# Patient Record
Sex: Female | Born: 1955 | Race: Black or African American | Hispanic: No | Marital: Single | Smoking: Never smoker
Health system: Southern US, Community
[De-identification: ages and names within clinical notes are randomized; demographics above are authoritative.]

## PROBLEM LIST (undated history)

## (undated) DIAGNOSIS — M1990 Unspecified osteoarthritis, unspecified site: Secondary | ICD-10-CM

## (undated) DIAGNOSIS — I10 Essential (primary) hypertension: Secondary | ICD-10-CM

## (undated) HISTORY — DX: Unspecified osteoarthritis, unspecified site: M19.90

## (undated) HISTORY — DX: Essential (primary) hypertension: I10

---

## 2020-11-29 DIAGNOSIS — K8020 Calculus of gallbladder without cholecystitis without obstruction: Secondary | ICD-10-CM

## 2020-11-29 HISTORY — DX: Calculus of gallbladder without cholecystitis without obstruction: K80.20

## 2020-11-30 ENCOUNTER — Other Ambulatory Visit: Payer: Self-pay

## 2020-11-30 ENCOUNTER — Encounter: Payer: Self-pay | Admitting: Internal Medicine

## 2020-11-30 ENCOUNTER — Ambulatory Visit (INDEPENDENT_AMBULATORY_CARE_PROVIDER_SITE_OTHER): Payer: 59 | Admitting: Internal Medicine

## 2020-11-30 ENCOUNTER — Ambulatory Visit: Payer: 59 | Admitting: Internal Medicine

## 2020-11-30 VITALS — BP 198/94 | HR 74 | Ht 63.0 in | Wt 304.0 lb

## 2020-11-30 DIAGNOSIS — Z131 Encounter for screening for diabetes mellitus: Secondary | ICD-10-CM

## 2020-11-30 DIAGNOSIS — Z6841 Body Mass Index (BMI) 40.0 and over, adult: Secondary | ICD-10-CM

## 2020-11-30 DIAGNOSIS — Z1152 Encounter for screening for COVID-19: Secondary | ICD-10-CM | POA: Diagnosis not present

## 2020-11-30 DIAGNOSIS — G4733 Obstructive sleep apnea (adult) (pediatric): Secondary | ICD-10-CM

## 2020-11-30 DIAGNOSIS — I10 Essential (primary) hypertension: Secondary | ICD-10-CM | POA: Insufficient documentation

## 2020-11-30 LAB — POC COVID19 BINAXNOW: SARS Coronavirus 2 Ag: NEGATIVE

## 2020-11-30 LAB — GLUCOSE, POCT (MANUAL RESULT ENTRY): POC Glucose: 101 mg/dl — AB (ref 70–99)

## 2020-11-30 MED ORDER — LISINOPRIL-HYDROCHLOROTHIAZIDE 20-12.5 MG PO TABS: 1.0000 | ORAL_TABLET | Freq: Every day | ORAL | 3 refills | Status: DC

## 2020-11-30 NOTE — Assessment & Plan Note (Signed)
-   Today, the patient's blood pressure is not well managed on unknown med. - The patient will change the current treatment regimen.  - I encouraged the patient to eat a low-sodium diet to help control blood pressure. - I encouraged the patient to live an active lifestyle and complete activities that increases heart rate to 85% target heart rate at least 5 times per week for one hour.

## 2020-11-30 NOTE — Assessment & Plan Note (Signed)
Pt is covid neg

## 2020-11-30 NOTE — Assessment & Plan Note (Signed)
Need evaluation/ lose wt

## 2020-11-30 NOTE — Assessment & Plan Note (Signed)
-   I encouraged the patient to lose weight.  - I educated them on making healthy dietary choices including eating more fruits and vegetables and less fried foods. - I encouraged the patient to exercise more, and educated on the benefits of exercise including weight loss, diabetes prevention, and hypertension management.   

## 2020-11-30 NOTE — Progress Notes (Signed)
Acute Office Visit  Subjective:    Patient ID: Dawn Mahoney, female    DOB: 08-Feb-1956, 65 y.o.   MRN: [id number]  Chief Complaint  Patient presents with  . New Patient (Initial Visit)    Hypertension This is a chronic problem. The current episode started more than 1 year ago. The problem is unchanged. Associated symptoms include orthopnea (loud snoring) and shortness of breath. Pertinent negatives include no anxiety, blurred vision, malaise/fatigue, neck pain or palpitations. Risk factors for coronary artery disease include obesity and post-menopausal state. Past treatments include nothing. Compliance problems include diet and exercise.  There is no history of angina, kidney disease, CAD/MI, CVA, heart failure or PVD. Identifiable causes of hypertension include sleep apnea. There is no history of chronic renal disease or a thyroid problem.   Patient is in today for hbp check  Past Medical History:  Diagnosis Date  . Hypertension     History reviewed. No pertinent surgical history.  History reviewed. No pertinent family history.  Social History   Socioeconomic History  . Marital status: Single    Spouse name: Not on file  . Number of children: Not on file  . Years of education: Not on file  . Highest education level: Not on file  Occupational History  . Not on file  Tobacco Use  . Smoking status: Never Smoker  . Smokeless tobacco: Never Used  Substance and Sexual Activity  . Alcohol use: Never  . Drug use: Never  . Sexual activity: Not Currently  Other Topics Concern  . Not on file  Social History Narrative  . Not on file   Social Determinants of Health   Financial Resource Strain: Not on file  Food Insecurity: Not on file  Transportation Needs: Not on file  Physical Activity: Not on file  Stress: Not on file  Social Connections: Not on file  Intimate Partner Violence: Not on file    No outpatient medications prior to visit.   No facility-administered  medications prior to visit.    No Known Allergies  Review of Systems  Constitutional: Negative for chills, fatigue and malaise/fatigue.  HENT: Positive for dental problem. Negative for nosebleeds.   Eyes: Negative for blurred vision and pain.  Respiratory: Positive for shortness of breath. Negative for cough, chest tightness and wheezing.   Cardiovascular: Positive for orthopnea (loud snoring). Negative for palpitations and leg swelling.  Gastrointestinal: Positive for abdominal distention.  Genitourinary: Negative for difficulty urinating and flank pain.  Musculoskeletal: Positive for arthralgias, back pain, gait problem and joint swelling. Negative for neck pain.  Neurological: Positive for dizziness. Negative for seizures.  Psychiatric/Behavioral: Negative for behavioral problems.       Objective:    Physical Exam Vitals reviewed.  Constitutional:      Appearance: She is obese.  HENT:     Head: Atraumatic.     Mouth/Throat:     Mouth: Mucous membranes are moist.  Eyes:     Pupils: Pupils are equal, round, and reactive to light.  Cardiovascular:     Rate and Rhythm: Normal rate and regular rhythm.     Heart sounds: No murmur heard.   Abdominal:     General: There is distension.  Musculoskeletal:     Cervical back: No rigidity.  Skin:    Coloration: Skin is not jaundiced.  Neurological:     General: No focal deficit present.  Psychiatric:        Behavior: Behavior normal.     BP Marland Kitchen)  198/94   Pulse 74   Ht 5' 3"  (1.6 m)   Wt (!) 304 lb (137.9 kg)   BMI 53.85 kg/m  Wt Readings from Last 3 Encounters:  11/30/20 (!) 304 lb (137.9 kg)    Health Maintenance Due  Topic Date Due  . Hepatitis C Screening  Never done  . COVID-19 Vaccine (1) Never done  . TETANUS/TDAP  Never done  . COLONOSCOPY (Pts 45-62yr Insurance coverage will need to be confirmed)  Never done  . MAMMOGRAM  Never done  . DEXA SCAN  Never done  . PNA vac Low Risk Adult (1 of 2 - PCV13)  Never done  . INFLUENZA VACCINE  Never done    There are no preventive care reminders to display for this patient.   No results found for: TSH No results found for: WBC, HGB, HCT, MCV, PLT No results found for: NA, K, CHLORIDE, CO2, GLUCOSE, BUN, CREATININE, BILITOT, ALKPHOS, AST, ALT, PROT, ALBUMIN, CALCIUM, ANIONGAP, EGFR, GFR No results found for: CHOL No results found for: HDL No results found for: LDLCALC No results found for: TRIG No results found for: CHOLHDL No results found for: HGBA1C     Assessment & Plan:   Problem List Items Addressed This Visit      Cardiovascular and Mediastinum   Essential hypertension    - Today, the patient's blood pressure is not well managed on unknown med. - The patient will change the current treatment regimen.  - I encouraged the patient to eat a low-sodium diet to help control blood pressure. - I encouraged the patient to live an active lifestyle and complete activities that increases heart rate to 85% target heart rate at least 5 times per week for one hour.          Relevant Medications   lisinopril-hydrochlorothiazide (ZESTORETIC) 20-12.5 MG tablet     Respiratory   OSA (obstructive sleep apnea)    Need evaluation/ lose wt        Other   Encounter for screening for COVID-19 - Primary    Pt is covid neg      Relevant Medications   lisinopril-hydrochlorothiazide (ZESTORETIC) 20-12.5 MG tablet   Other Relevant Orders   POC COVID-19 (Completed)   Class 3 severe obesity due to excess calories without serious comorbidity with body mass index (BMI) of 50.0 to 59.9 in adult ([facility]    - I encouraged the patient to lose weight.  - I educated them on making healthy dietary choices including eating more fruits and vegetables and less fried foods. - I encouraged the patient to exercise more, and educated on the benefits of exercise including weight loss, diabetes prevention, and hypertension management.           Meds ordered  this encounter  Medications  . lisinopril-hydrochlorothiazide (ZESTORETIC) 20-12.5 MG tablet    Sig: Take 1 tablet by mouth daily.    Dispense:  90 tablet    Refill:  3     JCletis Athens MD

## 2020-12-01 NOTE — Addendum Note (Signed)
Addended by: Alois Cliche on: 12/01/2020 09:19 AM   Modules accepted: Orders

## 2020-12-07 ENCOUNTER — Other Ambulatory Visit: Payer: Self-pay

## 2020-12-07 ENCOUNTER — Ambulatory Visit (INDEPENDENT_AMBULATORY_CARE_PROVIDER_SITE_OTHER): Payer: 59 | Admitting: Internal Medicine

## 2020-12-07 VITALS — BP 158/81 | HR 69 | Ht 63.0 in | Wt 300.1 lb

## 2020-12-07 DIAGNOSIS — I10 Essential (primary) hypertension: Secondary | ICD-10-CM

## 2020-12-07 DIAGNOSIS — G4733 Obstructive sleep apnea (adult) (pediatric): Secondary | ICD-10-CM

## 2020-12-07 DIAGNOSIS — K8020 Calculus of gallbladder without cholecystitis without obstruction: Secondary | ICD-10-CM

## 2020-12-07 DIAGNOSIS — Z6841 Body Mass Index (BMI) 40.0 and over, adult: Secondary | ICD-10-CM

## 2020-12-07 NOTE — Assessment & Plan Note (Signed)
-   I encouraged the patient to lose weight.  - I educated them on making healthy dietary choices including eating more fruits and vegetables and less fried foods. - I encouraged the patient to exercise more, and educated on the benefits of exercise including weight loss, diabetes prevention, and hypertension prevention.   

## 2020-12-07 NOTE — Assessment & Plan Note (Signed)
Blood pressure is better under control on ACE inhibitor and diuretic.  Patient was advised to lose weight walk on a daily basis.

## 2020-12-07 NOTE — Assessment & Plan Note (Signed)
Patient will need a sleep apnea test when the Covid epidemic is down.  In the meantime she should try to lose weight.

## 2020-12-07 NOTE — Progress Notes (Addendum)
Established Patient Office Visit  Subjective:  Patient ID: Dawn Mahoney, female    DOB: [birth year]  Age: 65 y.o. MRN: [id number]  CC:  Chief Complaint  Patient presents with  . lab results    Patient here today for 1 week follow up for lab results     HPI  Dawn Mahoney presents for annual review of the labs.  Past Medical History:  Diagnosis Date  . Hypertension     History reviewed. No pertinent surgical history.  History reviewed. No pertinent family history.  Social History   Socioeconomic History  . Marital status: Single    Spouse name: Not on file  . Number of children: Not on file  . Years of education: Not on file  . Highest education level: Not on file  Occupational History  . Not on file  Tobacco Use  . Smoking status: Never Smoker  . Smokeless tobacco: Never Used  Substance and Sexual Activity  . Alcohol use: Never  . Drug use: Never  . Sexual activity: Not Currently  Other Topics Concern  . Not on file  Social History Narrative  . Not on file   Social Determinants of Health   Financial Resource Strain: Not on file  Food Insecurity: Not on file  Transportation Needs: Not on file  Physical Activity: Not on file  Stress: Not on file  Social Connections: Not on file  Intimate Partner Violence: Not on file     Current Outpatient Medications:  .  lisinopril-hydrochlorothiazide (ZESTORETIC) 20-12.5 MG tablet, Take 1 tablet by mouth daily., Disp: 90 tablet, Rfl: 3   No Known Allergies  ROS Review of Systems  Constitutional: Negative.   HENT: Negative.   Eyes: Negative.   Respiratory: Negative.   Cardiovascular: Negative.   Gastrointestinal: Negative.   Endocrine: Negative.   Genitourinary: Negative.   Musculoskeletal: Negative.   Skin: Negative.   Allergic/Immunologic: Negative.   Neurological: Negative.   Hematological: Negative.   Psychiatric/Behavioral: Negative.   All other systems reviewed and are negative.      Objective:    Physical Exam Vitals reviewed.  Constitutional:      Appearance: Normal appearance. She is obese.  HENT:     Head: Atraumatic.     Mouth/Throat:     Mouth: Mucous membranes are moist.  Eyes:     Pupils: Pupils are equal, round, and reactive to light.  Neck:     Vascular: No carotid bruit.  Cardiovascular:     Rate and Rhythm: Normal rate and regular rhythm.     Pulses: Normal pulses.     Heart sounds: Normal heart sounds.  Pulmonary:     Effort: Pulmonary effort is normal.     Breath sounds: Normal breath sounds.  Abdominal:     General: Bowel sounds are normal. There is distension (Obese).     Palpations: Abdomen is soft. There is no hepatomegaly, splenomegaly or mass.     Tenderness: There is no abdominal tenderness.     Hernia: No hernia is present.  Musculoskeletal:        General: No tenderness.     Cervical back: Neck supple.     Right lower leg: No edema.     Left lower leg: No edema.  Skin:    Findings: No rash.  Neurological:     Mental Status: She is alert and oriented to person, place, and time.     Motor: No weakness.  Psychiatric:  Mood and Affect: Mood and affect normal.        Behavior: Behavior normal.     BP (!) 158/81   Pulse 69   Ht 5\' 3"  (1.6 m)   Wt (!) 300 lb 1.6 oz (136.1 kg)   BMI 53.16 kg/m  Wt Readings from Last 3 Encounters:  12/07/20 (!) 300 lb 1.6 oz (136.1 kg)  11/30/20 (!) 304 lb (137.9 kg)     Health Maintenance Due  Topic Date Due  . Hepatitis C Screening  Never done  . TETANUS/TDAP  Never done  . COLONOSCOPY (Pts 45-60yrs Insurance coverage will need to be confirmed)  Never done  . MAMMOGRAM  Never done  . DEXA SCAN  Never done  . PNA vac Low Risk Adult (1 of 2 - PCV13) Never done  . INFLUENZA VACCINE  Never done    There are no preventive care reminders to display for this patient.  Lab Results  Component Value Date   TSH 4.43 11/30/2020   Lab Results  Component Value Date   WBC 7.0  11/30/2020   HGB 14.6 11/30/2020   HCT 43.3 11/30/2020   MCV 96.9 11/30/2020   PLT 299 11/30/2020   Lab Results  Component Value Date   NA 140 11/30/2020   K 4.8 11/30/2020   CO2 23 11/30/2020   GLUCOSE 82 11/30/2020   BUN 12 11/30/2020   CREATININE 0.75 11/30/2020   BILITOT 0.4 11/30/2020   AST 46 (H) 11/30/2020   ALT 40 (H) 11/30/2020   PROT 9.0 (H) 11/30/2020   CALCIUM 9.5 11/30/2020   Lab Results  Component Value Date   CHOL 218 (H) 11/30/2020   Lab Results  Component Value Date   HDL 62 11/30/2020   Lab Results  Component Value Date   LDLCALC 138 (H) 11/30/2020   Lab Results  Component Value Date   TRIG 83 11/30/2020   Lab Results  Component Value Date   CHOLHDL 3.5 11/30/2020   No results found for: HGBA1C    Assessment & Plan:   Problem List Items Addressed This Visit      Cardiovascular and Mediastinum   Essential hypertension - Primary    Blood pressure is better under control on ACE inhibitor and diuretic.  Patient was advised to lose weight walk on a daily basis.        Respiratory   OSA (obstructive sleep apnea)    Patient will need a sleep apnea test when the Covid epidemic is down.  In the meantime she should try to lose weight.        Other   Class 3 severe obesity due to excess calories without serious comorbidity with body mass index (BMI) of 50.0 to 59.9 in adult [facility])    - I encouraged the patient to lose weight.  - I educated them on making healthy dietary choices including eating more fruits and vegetables and less fried foods. - I encouraged the patient to exercise more, and educated on the benefits of exercise including weight loss, diabetes prevention, and hypertension prevention.         Other Visit Diagnoses    Gallstones       Relevant Orders   US Abdomen Limited RUQ (LIVER/GB) (Completed)      No orders of the defined types were placed in this encounter.   Follow-up: No follow-ups on file.  Patient was  scheduled for ultrasound of abdomen because she has a history of gallstones in the  past and generally they wanted to do the surgery on her.  Her liver function test was slightly abnormal so we will do a diagnostic hepatitis profile.  Patient blood pressure is coming down.  She will probably his sleep test when the Covid epidemic goes down.  Cletis Athens, MD

## 2020-12-08 LAB — TEST AUTHORIZATION

## 2020-12-08 LAB — COMPLETE METABOLIC PANEL WITH GFR
AG Ratio: 0.8 (calc) — ABNORMAL LOW (ref 1.0–2.5)
ALT: 40 U/L — ABNORMAL HIGH (ref 6–29)
AST: 46 U/L — ABNORMAL HIGH (ref 10–35)
Albumin: 4.1 g/dL (ref 3.6–5.1)
Alkaline phosphatase (APISO): 114 U/L (ref 37–153)
BUN: 12 mg/dL (ref 7–25)
CO2: 23 mmol/L (ref 20–32)
Calcium: 9.5 mg/dL (ref 8.6–10.4)
Chloride: 104 mmol/L (ref 98–110)
Creat: 0.75 mg/dL (ref 0.50–0.99)
GFR, Est African American: 96 mL/min/{1.73_m2} (ref >=60)
GFR, Est Non African American: 83 mL/min/{1.73_m2} (ref >=60)
Globulin: 4.9 g/dL (calc) — ABNORMAL HIGH (ref 1.9–3.7)
Glucose, Bld: 82 mg/dL (ref 65–99)
Potassium: 4.8 mmol/L (ref 3.5–5.3)
Sodium: 140 mmol/L (ref 135–146)
Total Bilirubin: 0.4 mg/dL (ref 0.2–1.2)
Total Protein: 9 g/dL — ABNORMAL HIGH (ref 6.1–8.1)

## 2020-12-08 LAB — CBC WITH DIFFERENTIAL/PLATELET
Absolute Monocytes: 413 cells/uL (ref 200–950)
Basophils Absolute: 63 cells/uL (ref 0–200)
Basophils Relative: 0.9 %
Eosinophils Absolute: 231 cells/uL (ref 15–500)
Eosinophils Relative: 3.3 %
HCT: 43.3 % (ref 35.0–45.0)
Hemoglobin: 14.6 g/dL (ref 11.7–15.5)
Lymphs Abs: 3668 cells/uL (ref 850–3900)
MCH: 32.7 pg (ref 27.0–33.0)
MCHC: 33.7 g/dL (ref 32.0–36.0)
MCV: 96.9 fL (ref 80.0–100.0)
MPV: 11 fL (ref 7.5–12.5)
Monocytes Relative: 5.9 %
Neutro Abs: 2625 cells/uL (ref 1500–7800)
Neutrophils Relative %: 37.5 %
Platelets: 299 10*3/uL (ref 140–400)
RBC: 4.47 10*6/uL (ref 3.80–5.10)
RDW: 12.8 % (ref 11.0–15.0)
Total Lymphocyte: 52.4 %
WBC: 7 10*3/uL (ref 3.8–10.8)

## 2020-12-08 LAB — LIPID PANEL
Cholesterol: 218 mg/dL — ABNORMAL HIGH (ref <200)
HDL: 62 mg/dL (ref >=50)
LDL Cholesterol (Calc): 138 mg/dL (calc) — ABNORMAL HIGH
Non-HDL Cholesterol (Calc): 156 mg/dL (calc) — ABNORMAL HIGH (ref <130)
Total CHOL/HDL Ratio: 3.5 (calc) (ref <5.0)
Triglycerides: 83 mg/dL (ref <150)

## 2020-12-08 LAB — TSH: TSH: 4.43 mIU/L (ref 0.40–4.50)

## 2020-12-08 LAB — HEPATITIS B SURFACE ANTIBODY, QUANTITATIVE: Hep B S AB Quant (Post): <5 m[IU]/mL — ABNORMAL LOW (ref >=10)

## 2020-12-08 NOTE — Addendum Note (Signed)
Addended by: Alois Cliche on: 12/08/2020 10:46 AM   Modules accepted: Orders

## 2020-12-14 NOTE — Addendum Note (Signed)
Addended by: Alois Cliche on: 12/14/2020 11:16 AM   Modules accepted: Orders

## 2020-12-20 ENCOUNTER — Other Ambulatory Visit: Payer: Self-pay

## 2020-12-20 ENCOUNTER — Ambulatory Visit
Admission: RE | Admit: 2020-12-20 | Discharge: 2020-12-20 | Disposition: A | Discharge status: Home / Self Care | Payer: 59 | Source: Ambulatory Visit | Attending: Internal Medicine | Admitting: Internal Medicine

## 2020-12-20 DIAGNOSIS — K8020 Calculus of gallbladder without cholecystitis without obstruction: Secondary | ICD-10-CM | POA: Diagnosis not present

## 2020-12-21 ENCOUNTER — Ambulatory Visit: Payer: 59 | Admitting: Nurse Practitioner

## 2020-12-22 ENCOUNTER — Encounter: Payer: Self-pay | Admitting: Internal Medicine

## 2021-01-04 ENCOUNTER — Encounter: Payer: Self-pay | Admitting: Internal Medicine

## 2021-01-04 ENCOUNTER — Ambulatory Visit (INDEPENDENT_AMBULATORY_CARE_PROVIDER_SITE_OTHER): Payer: 59 | Admitting: Internal Medicine

## 2021-01-04 ENCOUNTER — Other Ambulatory Visit: Payer: Self-pay

## 2021-01-04 VITALS — BP 162/89 | HR 67 | Ht 63.0 in | Wt 303.0 lb

## 2021-01-04 DIAGNOSIS — G4733 Obstructive sleep apnea (adult) (pediatric): Secondary | ICD-10-CM

## 2021-01-04 DIAGNOSIS — Z1239 Encounter for other screening for malignant neoplasm of breast: Secondary | ICD-10-CM

## 2021-01-04 DIAGNOSIS — I10 Essential (primary) hypertension: Secondary | ICD-10-CM

## 2021-01-04 DIAGNOSIS — R109 Unspecified abdominal pain: Secondary | ICD-10-CM

## 2021-01-04 DIAGNOSIS — Z6841 Body Mass Index (BMI) 40.0 and over, adult: Secondary | ICD-10-CM

## 2021-01-07 ENCOUNTER — Encounter: Payer: Self-pay | Admitting: Internal Medicine

## 2021-01-07 NOTE — Progress Notes (Signed)
Established Patient Office Visit  Subjective:  Patient ID: Dawn Mahoney, female    DOB: Nov 13, 1955  Age: 65 y.o. MRN: [id number]  CC:  Chief Complaint  Patient presents with  . ultrasound results    Patient here today for abdominal US results   . Hypertension    HPI  Dawn Mahoney presents for checkup, patient has an abdominal ultrasound done recently and she wants to get a report on that.,  Ultrasound of the abdomen revealed small calculi in the gallbladder patient however is asymptomatic except for some pain in the left abdomen due to the gas.  Murphy sign is negative.  The blood test does not show any elevation of SGOT or SGPT suggestive of cholecystitis, patient will be referred to GI specialist for endoscopy or further evaluation, patient also need a pelvic examination, patient will be referred to the OB/GYN, patient will be also having mammogram in the near future.  She has been advised to lose weight. Past Medical History:  Diagnosis Date  . Hypertension     History reviewed. No pertinent surgical history.  History reviewed. No pertinent family history.  Social History   Socioeconomic History  . Marital status: Single    Spouse name: Not on file  . Number of children: Not on file  . Years of education: Not on file  . Highest education level: Not on file  Occupational History  . Not on file  Tobacco Use  . Smoking status: Never Smoker  . Smokeless tobacco: Never Used  Substance and Sexual Activity  . Alcohol use: Never  . Drug use: Never  . Sexual activity: Not Currently  Other Topics Concern  . Not on file  Social History Narrative  . Not on file   Social Determinants of Health   Financial Resource Strain: Not on file  Food Insecurity: Not on file  Transportation Needs: Not on file  Physical Activity: Not on file  Stress: Not on file  Social Connections: Not on file  Intimate Partner Violence: Not on file     Current Outpatient Medications:  .   lisinopril-hydrochlorothiazide (ZESTORETIC) 20-12.5 MG tablet, Take 1 tablet by mouth daily., Disp: 90 tablet, Rfl: 3   No Known Allergies  ROS Review of Systems  Constitutional: Negative.   HENT: Negative.   Eyes: Negative.   Respiratory: Negative.  Negative for choking and chest tightness.   Cardiovascular: Negative.  Negative for chest pain.  Gastrointestinal: Negative.        Complain of left-sided abdominal discomfort  Endocrine: Negative.   Genitourinary: Negative.   Musculoskeletal: Negative.   Skin: Negative.   Allergic/Immunologic: Negative.   Neurological: Negative.   Hematological: Negative.   Psychiatric/Behavioral: Negative.   All other systems reviewed and are negative.     Objective:    Physical Exam Vitals reviewed.  Constitutional:      Appearance: Normal appearance. She is obese.  HENT:     Mouth/Throat:     Mouth: Mucous membranes are moist.  Eyes:     Pupils: Pupils are equal, round, and reactive to light.  Neck:     Vascular: No carotid bruit.  Cardiovascular:     Rate and Rhythm: Normal rate and regular rhythm.     Pulses: Normal pulses.     Heart sounds: Normal heart sounds.  Pulmonary:     Effort: Pulmonary effort is normal.     Breath sounds: Normal breath sounds.  Abdominal:     General: Bowel sounds are normal.  There is distension.     Palpations: Abdomen is soft. There is no hepatomegaly or splenomegaly.     Tenderness: There is no right CVA tenderness, guarding or rebound.     Hernia: No hernia is present.  Musculoskeletal:        General: No tenderness.     Cervical back: Neck supple.     Right lower leg: No edema.     Left lower leg: No edema.  Skin:    Findings: No rash.  Neurological:     Mental Status: She is alert and oriented to person, place, and time.     Motor: No weakness.  Psychiatric:        Mood and Affect: Mood and affect normal.        Behavior: Behavior normal.     BP (!) 162/89   Pulse 67   Ht 5\' 3"   (1.6 m)   Wt (!) 303 lb (137.4 kg)   BMI 53.67 kg/m  Wt Readings from Last 3 Encounters:  01/04/21 (!) 303 lb (137.4 kg)  12/07/20 (!) 300 lb 1.6 oz (136.1 kg)  11/30/20 (!) 304 lb (137.9 kg)     Health Maintenance Due  Topic Date Due  . Hepatitis C Screening  Never done  . TETANUS/TDAP  Never done  . COLONOSCOPY (Pts 45-65yrs Insurance coverage will need to be confirmed)  Never done  . MAMMOGRAM  Never done  . DEXA SCAN  Never done  . PNA vac Low Risk Adult (1 of 2 - PCV13) Never done  . INFLUENZA VACCINE  Never done    There are no preventive care reminders to display for this patient.  Lab Results  Component Value Date   TSH 4.43 11/30/2020   Lab Results  Component Value Date   WBC 7.0 11/30/2020   HGB 14.6 11/30/2020   HCT 43.3 11/30/2020   MCV 96.9 11/30/2020   PLT 299 11/30/2020   Lab Results  Component Value Date   NA 140 11/30/2020   K 4.8 11/30/2020   CO2 23 11/30/2020   GLUCOSE 82 11/30/2020   BUN 12 11/30/2020   CREATININE 0.75 11/30/2020   BILITOT 0.4 11/30/2020   AST 46 (H) 11/30/2020   ALT 40 (H) 11/30/2020   PROT 9.0 (H) 11/30/2020   CALCIUM 9.5 11/30/2020   Lab Results  Component Value Date   CHOL 218 (H) 11/30/2020   Lab Results  Component Value Date   HDL 62 11/30/2020   Lab Results  Component Value Date   LDLCALC 138 (H) 11/30/2020   Lab Results  Component Value Date   TRIG 83 11/30/2020   Lab Results  Component Value Date   CHOLHDL 3.5 11/30/2020   No results found for: HGBA1C    Assessment & Plan:   Problem List Items Addressed This Visit      Cardiovascular and Mediastinum   Essential hypertension    Blood pressure was found to be elevated on today's visit although it has been coming down slowly.  She was advised to follow low-salt diet.        Respiratory   OSA (obstructive sleep apnea)    Patient will be scheduled for a sleep test.        Other   Class 3 severe obesity due to excess calories without  serious comorbidity with body mass index (BMI) of 50.0 to 59.9 in adult [facility])    - I encouraged the patient to lose weight.  - I educated  them on making healthy dietary choices including eating more fruits and vegetables and less fried foods. - I encouraged the patient to exercise more, and educated on the benefits of exercise including weight loss, diabetes prevention, and hypertension prevention.         Other Visit Diagnoses    Abdominal pain, unspecified abdominal location    -  Primary   Relevant Orders   Ambulatory referral to Gastroenterology   Ambulatory referral to Obstetrics / Gynecology   Encounter for screening for malignant neoplasm of breast, unspecified screening modality       Relevant Orders   MM 3D SCREEN BREAST BILATERAL     Patient will be referred to OB/GYN, GI specialist, she was advised to lose weight, return back in 3 months or earlier as needed No orders of the defined types were placed in this encounter.   Follow-up: No follow-ups on file.    Cletis Athens, MD

## 2021-01-07 NOTE — Assessment & Plan Note (Signed)
-   I encouraged the patient to lose weight.  - I educated them on making healthy dietary choices including eating more fruits and vegetables and less fried foods. - I encouraged the patient to exercise more, and educated on the benefits of exercise including weight loss, diabetes prevention, and hypertension prevention.   

## 2021-01-07 NOTE — Assessment & Plan Note (Signed)
Blood pressure was found to be elevated on today's visit although it has been coming down slowly.  She was advised to follow low-salt diet.

## 2021-01-07 NOTE — Assessment & Plan Note (Signed)
Patient will be scheduled for a sleep test.

## 2021-01-10 ENCOUNTER — Encounter: Payer: Self-pay | Admitting: Nurse Practitioner

## 2021-01-18 ENCOUNTER — Other Ambulatory Visit: Payer: 59

## 2021-01-18 ENCOUNTER — Ambulatory Visit (INDEPENDENT_AMBULATORY_CARE_PROVIDER_SITE_OTHER): Payer: 59 | Admitting: Nurse Practitioner

## 2021-01-18 VITALS — BP 144/62 | HR 87 | Ht 59.5 in | Wt 300.0 lb

## 2021-01-18 DIAGNOSIS — R1012 Left upper quadrant pain: Secondary | ICD-10-CM

## 2021-01-18 DIAGNOSIS — K219 Gastro-esophageal reflux disease without esophagitis: Secondary | ICD-10-CM | POA: Diagnosis not present

## 2021-01-18 DIAGNOSIS — R1011 Right upper quadrant pain: Secondary | ICD-10-CM | POA: Diagnosis not present

## 2021-01-18 DIAGNOSIS — Z1211 Encounter for screening for malignant neoplasm of colon: Secondary | ICD-10-CM

## 2021-01-18 NOTE — Patient Instructions (Addendum)
Ti o ba j? ?dun 63 tabi agbalagba, at?ka ibi-ara r? y? ki o wa laarin 23-30. At?ka iwuwo ara r? j? 59.58 kg/m. Ti eyi ko ba jade ni ibiti a ti s? t?l?, j?w? ronu at?le p?lu [place]?ju ak?k?Marland Kitchen  Ti o ba j? ?dun 62 tabi kker, at?ka ibi-ara r? y? ki o wa laarin 19-25. At?ka iwuwo ara r? j? 59.58 kg/m. Ti eyi ko ba jade ni iw?n ti a m?nuba ti a ?e akoj?, j?w? ronu t?le at?le p?lu [place]?ju [place]?b?r?Marland Kitchen  A yoo kan si ? ni kete ti a ba ni ?j? kan fun Endoscopy ati Colonoscopy ti yoo ?eto ni [employer].  [place] r? ti beere pe ki o l? si ipele ipil? ile fun i?? laabu ?aaju ki o to l? loni. T? "B" lori elevator. Laabu naa wa ni ?nu-?na ak?k? ni apa osi bi o ?e jade kuro ni ategun naa.   T?le ni isunm?tosi ni [employer].  O ?eun fun gbigbe mi le p?lu abojuto r? ati yiyan It?ju [employer].  Tye Savoy, NP-C  If you are age 79 or older, your body mass index should be between 23-30. Your Body mass index is 59.58 kg/m. If this is out of the aforementioned range listed, please consider follow up with your Primary Care Provider.  If you are age 68 or younger, your body mass index should be between 19-25. Your Body mass index is 59.58 kg/m. If this is out of the aformentioned range listed, please consider follow up with your Primary Care Provider.   We will contact you once we have a date for your Endoscopy and Colonoscopy that will be scheduled at [facility].  Your provider has requested that you go to the basement level for lab work before leaving today. Press "B" on the elevator. The lab is located at the first door on the left as you exit the elevator.   Follow up pending at this time.  Thank you for entrusting me with your care and choosing [facility].  Tye Savoy, NP-C

## 2021-01-18 NOTE — Progress Notes (Signed)
I agree with the above note, plan 

## 2021-01-18 NOTE — Progress Notes (Signed)
ASSESSMENT AND PLAN    #  65 yo non-English-speaking female from [place] with chronic LUQ and RUQ pain, both worse on an empty stomach and relieved with food. Pain is otherwise vague though language barrier probably interfering with her description. She is having LUQ in clinic today.   She has a remote history of PUD in [place], son worried about recurrent gastric ulcer. She also has a history of GERD, takes an anti-acid from [place] as needed.  --Avoid NSAIDS for now --Omeprazole 40 mg q am 30 minutes prior to breakfast.  --Recent ultrasound shows cholelithiasis but I think that is just an incidental finding at this point .  --For further evaluation patient will be scheduled for an EGD.  Will arrange for screening colonoscopy to be done at the same time. The risks and benefits of EGD were discussed and the patient agrees to proceed. Because of patient's BMI, procedures will need to be done at the hospital patient needs a colonoscopy for colon cancer screening.    # Mildly elevated liver enzymes. Steatosis on recent US.  --Son has HBV. Will check HBV and HCV serologies   # Cholelithiasis, asymptomatic at this point,  I think.   # GERD, occasional heartburn relieved with anti-acid from [place].  --Starting her on omeprazole for upper abdominal pain, this should help her GERD symptoms as well.  --PCP has already discussed with her the need for weight loss   HISTORY OF PRESENT ILLNESS     Chief Complaint : Abdominal pain, gallstones, heartburn  Dawn Mahoney is a 65 y.o. female from [place] with [place] significant for morbid obesity, cholelithiasis,  GERD, PUD, HTN, OSA, arthritis  Dawn Mahoney is referred by PCP for evaluation of abdominal pain.  Patient does not speak [place], her son serves as Astronomer / Optometrist  Patient gives a longstanding history of upper abdominal pain .  She describes a "biting" sensation in her LUQ and RUQ when hungry. The pain is alleviated with food.  Nothing else makes the pain better, it is not relieved with defecation.  Her bowel movements are normal.  The pain is not associated with nausea/vomiting .  She gives a history of a stomach ulcer of unclear etiology in [place] 15 years ago. Her son is worried that she has a persistent ulcer. She takes Ibuprofen as needed but sounds like no more than a few doses a month.   Patient also gives a several year history of postprandial heartburn.  She takes an anti-acid from [place] as needed which she thinks it is no more than once a week but has difficulty quantifying.  No dysphagia.  Patient complains of a transient  'biting" pain which occurs randomly anywhere on her body such as her legs and back.    BMs are normal. No blood in stool. No [place] of colon cancer. Patient has never had a colonoscopy.   Patient's son has hepatitis B. He wants his mother checked for it, he wonders if he got it from her at birth.   Data Reviewed: 11/30/20 CBC normal Total protein 9, AST 46, ALT 40  [place] --cholelithiasis, hepatic steatosis.     Past Medical History:  Diagnosis Date  . Arthritis    both knees  . Gallstones 11/2020  . Hypertension      No past surgical history on file. Family History  Problem Relation Age of Onset  . Colon cancer Neg Hx        unknown   Social History  Tobacco Use  . Smoking status: Never Smoker  . Smokeless tobacco: Never Used  Substance Use Topics  . Alcohol use: Never  . Drug use: Never   Current Outpatient Medications  Medication Sig Dispense Refill  . acetaminophen (TYLENOL) 325 MG tablet Take 650 mg by mouth every 6 (six) hours as needed.    Marland Kitchen lisinopril-hydrochlorothiazide (ZESTORETIC) 20-12.5 MG tablet Take 1 tablet by mouth daily. 90 tablet 3   No current facility-administered medications for this visit.   No Known Allergies   Review of Systems: Positive for arthritis and back pain.  All other systems reviewed and negative except where noted in HPI.    PHYSICAL EXAM :    Wt Readings from Last 3 Encounters:  01/18/21 300 lb (136.1 kg)  01/04/21 (!) 303 lb (137.4 kg)  12/07/20 (!) 300 lb 1.6 oz (136.1 kg)    BP (!) 144/62   Pulse 87   Ht 4' 11.5" (1.511 m) Comment: pt measured without shoes  Wt 300 lb (136.1 kg)   SpO2 92%   BMI 59.58 kg/m  Constitutional:  Pleasant but quiet morbidly obese female in no acute distress. Psychiatric: Normal mood and affect. Behavior is normal. EENT: Pupils normal.  Conjunctivae are normal. No scleral icterus. Neck supple.  Cardiovascular: Normal rate, regular rhythm. No edema Pulmonary/chest: Effort normal and breath sounds normal. No wheezing, rales or rhonchi. Abdominal: Soft, nondistended, nontender. Bowel sounds active throughout. There are no obvious masses palpable.  Skin: Skin is warm and dry. No rashes noted.  Tye Savoy, NP  01/18/2021, 3:28 PM  Cc:  Referring Provider Cletis Athens, MD

## 2021-01-20 LAB — HEPATITIS C ANTIBODY
Hepatitis C Ab: NONREACTIVE
SIGNAL TO CUT-OFF: 0.03 (ref <1.00)

## 2021-01-20 LAB — HEPATITIS B SURFACE ANTIBODY,QUALITATIVE: Hep B S Ab: NONREACTIVE

## 2021-01-20 LAB — HEPATITIS B SURFACE ANTIGEN: Hepatitis B Surface Ag: REACTIVE — AB

## 2021-01-20 LAB — HEPATITIS B CORE ANTIBODY, TOTAL: Hep B Core Total Ab: REACTIVE — AB

## 2021-01-30 ENCOUNTER — Telehealth: Payer: Self-pay

## 2021-01-30 DIAGNOSIS — B181 Chronic viral hepatitis B without delta-agent: Secondary | ICD-10-CM

## 2021-01-30 NOTE — Telephone Encounter (Signed)
-----   Message from Willia Craze, NP sent at 01/26/2021  6:29 PM EDT ----- Please contact patient's son and let him know that his mother is positive for hepatitis B (chronic).  Please refer her to the ID clinic for treatment.  Thank you

## 2021-01-30 NOTE — Telephone Encounter (Signed)
I spoke with the patient's son.  Reported to him the results and recommendations He understands that he will be contacted directly with an appointment date and time for [facility]

## 2021-01-31 ENCOUNTER — Encounter: Payer: Self-pay | Admitting: Obstetrics & Gynecology

## 2021-01-31 ENCOUNTER — Ambulatory Visit (INDEPENDENT_AMBULATORY_CARE_PROVIDER_SITE_OTHER): Payer: 59 | Admitting: Obstetrics & Gynecology

## 2021-01-31 ENCOUNTER — Other Ambulatory Visit: Payer: Self-pay

## 2021-01-31 ENCOUNTER — Ambulatory Visit (INDEPENDENT_AMBULATORY_CARE_PROVIDER_SITE_OTHER): Payer: 59 | Admitting: Infectious Disease

## 2021-01-31 VITALS — BP 187/96 | HR 69 | Wt 301.0 lb

## 2021-01-31 VITALS — BP 185/81 | HR 71 | Temp 98.5°F | Wt 301.0 lb

## 2021-01-31 DIAGNOSIS — Z6841 Body Mass Index (BMI) 40.0 and over, adult: Secondary | ICD-10-CM | POA: Diagnosis not present

## 2021-01-31 DIAGNOSIS — Z5320 Procedure and treatment not carried out because of patient's decision for unspecified reasons: Secondary | ICD-10-CM

## 2021-01-31 DIAGNOSIS — R1010 Upper abdominal pain, unspecified: Secondary | ICD-10-CM

## 2021-01-31 DIAGNOSIS — B181 Chronic viral hepatitis B without delta-agent: Secondary | ICD-10-CM

## 2021-01-31 DIAGNOSIS — I10 Essential (primary) hypertension: Secondary | ICD-10-CM | POA: Diagnosis not present

## 2021-01-31 DIAGNOSIS — B1910 Unspecified viral hepatitis B without hepatic coma: Secondary | ICD-10-CM

## 2021-01-31 HISTORY — DX: Unspecified viral hepatitis B without hepatic coma: B19.10

## 2021-01-31 NOTE — Progress Notes (Signed)
   GYNECOLOGY OFFICE VISIT NOTE  History:   Dawn Mahoney is a 65 y.o. [id number] here today for evaluation of upper abdominal pain and for routine pelvic exam/pap smear.  Referred by PCP.  [place] [employer] interpreter used and her son also used (they signed refusal of medical interpreter form).  She is undergoing evaluation of long history of upper abdominal pain that occurs after meals.  Feels pain originates in upper right side and then moves towards left.  Can move to neck and legs.  No pelvic pain. No vaginal bleeding.  She denies any abnormal vaginal discharge or other concerns.    Past Medical History:  Diagnosis Date  . Arthritis    both knees  . Gallstones 11/2020  . Hepatitis B 01/31/2021  . Hypertension    History reviewed. No pertinent surgical history.  The following portions of the patient's history were reviewed and updated as appropriate: allergies, current medications, past family history, past medical history, past social history, past surgical history and problem list.    Review of Systems:  Pertinent items noted in HPI and remainder of comprehensive ROS otherwise negative.  Physical Exam:  BP (!) 187/96   Pulse 69   Wt (!) 301 lb (136.5 kg)   BMI 59.78 kg/m  CONSTITUTIONAL: Well-developed, well-nourished female in no acute distress.  HEENT:  Normocephalic, atraumatic. External right and left ear normal. No scleral icterus.  NECK: Normal range of motion, supple, no masses noted on observation SKIN: No rash noted. Not diaphoretic. No erythema. No pallor. MUSCULOSKELETAL: Normal range of motion. No edema noted. NEUROLOGIC: Alert and oriented to person, place, and time. Normal muscle tone coordination. No cranial nerve deficit noted. PSYCHIATRIC: Normal mood and affect. Normal behavior. Normal judgment and thought content. CARDIOVASCULAR: Normal heart rate noted RESPIRATORY: Effort and breath sounds normal, no problems with respiration noted ABDOMEN: Obese. Moderate RUQ and  LUQ pain on deep palpation. No lower abdominal pain or pelvic pain on external exam.  No masses noted. No other overt distention noted.   PELVIC: Deferred by patient      Assessment and Plan:      1. Recurrent upper abdominal pain Most likely of GI etiology. Patient and son informed of this, they reported that she is scheduled for endoscopy and colonoscopy soon.  No GYN etiology of pain noted on exam. Known history of chronic Hepatitis B.  2. Pap smear of cervix declined Patient declined pelvic exam and pap smear today.  Routine preventative health maintenance measures emphasized, son will schedule mammogram soon. Please refer to After Visit Summary for other counseling recommendations.   Return for any gynecologic concerns.    I spent 20 minutes dedicated to the care of this patient including pre-visit review of records, face to face time with the patient discussing her conditions and treatments and post visit ordering of testing.    Verita Schneiders, MD, [place] [facility] [employer], [place]

## 2021-01-31 NOTE — Progress Notes (Signed)
Subjective:   Reason for infectious disease consult chronic hepatitis B without coma  Requesting physician: Dr. Harolyn Rutherford  PCP:: Beckie Salts FNP   Patient ID: Dawn Mahoney, female    DOB: 1956/10/29, 65 y.o.   MRN: [id number]  HPI  65 year old [place] lady with a past medical history significant for gallstones hypertension who is experiencing right upper quadrant pain and continues to experience this pain when labs were done to work this up including a comprehensive metabolic panel and hepatitis panel.  Her hepatitis B surface antigen came back positive.  Transaminases were only minimally elevated right upper quadrant ultrasound with normal echogenicity of the liver with no lesions and some gallstones but without evidence of cholecystitis.  Her son who accompanied her today is also someone with chronic hepatitis b and on vemlidy. He told me that NOT all of his siblings have contracted HBV from Mom and we had discussion re potential means by which she might have acquired it.  Past Medical History:  Diagnosis Date  . Arthritis    both knees  . Gallstones 11/2020  . Hepatitis B 01/31/2021  . Hypertension     No past surgical history on file.  Family History  Problem Relation Age of Onset  . Colon cancer Neg Hx        unknown      Social History   Socioeconomic History  . Marital status: Single    Spouse name: Not on file  . Number of children: Not on file  . Years of education: Not on file  . Highest education level: Not on file  Occupational History  . Not on file  Tobacco Use  . Smoking status: Never Smoker  . Smokeless tobacco: Never Used  Substance and Sexual Activity  . Alcohol use: Never  . Drug use: Never  . Sexual activity: Not Currently  Other Topics Concern  . Not on file  Social History Narrative  . Not on file   Social Determinants of Health   Financial Resource Strain: Not on file  Food Insecurity: Not on file  Transportation Needs: Not on  file  Physical Activity: Not on file  Stress: Not on file  Social Connections: Not on file    No Known Allergies   Current Outpatient Medications:  .  acetaminophen (TYLENOL) 325 MG tablet, Take 650 mg by mouth every 6 (six) hours as needed., Disp: , Rfl:  .  lisinopril-hydrochlorothiazide (ZESTORETIC) 20-12.5 MG tablet, Take 1 tablet by mouth daily., Disp: 90 tablet, Rfl: 3   Review of Systems  Constitutional: Negative for activity change, appetite change, chills, diaphoresis, fatigue, fever and unexpected weight change.  HENT: Negative for congestion, rhinorrhea, sinus pressure, sneezing, sore throat and trouble swallowing.   Eyes: Negative for photophobia and visual disturbance.  Respiratory: Negative for cough, chest tightness, shortness of breath, wheezing and stridor.   Cardiovascular: Negative for chest pain, palpitations and leg swelling.  Gastrointestinal: Positive for abdominal pain. Negative for abdominal distention, anal bleeding, blood in stool, constipation, diarrhea, nausea and vomiting.  Genitourinary: Negative for difficulty urinating, dysuria, flank pain and hematuria.  Musculoskeletal: Negative for arthralgias, back pain, gait problem, joint swelling and myalgias.  Skin: Negative for color change, pallor, rash and wound.  Neurological: Negative for dizziness, tremors, weakness and light-headedness.  Hematological: Negative for adenopathy. Does not bruise/bleed easily.  Psychiatric/Behavioral: Negative for agitation, behavioral problems, confusion, decreased concentration, dysphoric mood and sleep disturbance.       Objective:   Physical Exam Constitutional:  General: She is not in acute distress.    Appearance: Normal appearance. She is well-developed. She is not ill-appearing or diaphoretic.  HENT:     Head: Normocephalic and atraumatic.     Right Ear: Hearing and external ear normal.     Left Ear: Hearing and external ear normal.     Nose: No nasal  deformity or rhinorrhea.  Eyes:     General: No scleral icterus.    Conjunctiva/sclera: Conjunctivae normal.     Right eye: Right conjunctiva is not injected.     Left eye: Left conjunctiva is not injected.     Pupils: Pupils are equal, round, and reactive to light.  Neck:     Vascular: No JVD.  Cardiovascular:     Rate and Rhythm: Normal rate and regular rhythm.     Heart sounds: S1 normal and S2 normal.  Pulmonary:     Effort: Pulmonary effort is normal. No respiratory distress.  Abdominal:     General: Bowel sounds are normal. There is no distension.     Palpations: Abdomen is soft.     Tenderness: There is abdominal tenderness in the right lower quadrant and left lower quadrant.  Musculoskeletal:        General: Normal range of motion.     Right shoulder: Normal.     Left shoulder: Normal.     Cervical back: Normal range of motion and neck supple.     Right hip: Normal.     Left hip: Normal.     Right knee: Normal.     Left knee: Normal.  Lymphadenopathy:     Head:     Right side of head: No submandibular, preauricular or posterior auricular adenopathy.     Left side of head: No submandibular, preauricular or posterior auricular adenopathy.     Cervical: No cervical adenopathy.     Right cervical: No superficial or deep cervical adenopathy.    Left cervical: No superficial or deep cervical adenopathy.  Skin:    General: Skin is warm and dry.     Coloration: Skin is not pale.     Findings: No abrasion, bruising, ecchymosis, erythema, lesion or rash.     Nails: There is no clubbing.  Neurological:     General: No focal deficit present.     Mental Status: She is alert and oriented to person, place, and time.     Sensory: No sensory deficit.     Coordination: Coordination normal.     Gait: Gait normal.  Psychiatric:        Attention and Perception: She is attentive.        Mood and Affect: Mood normal.        Speech: Speech normal.        Behavior: Behavior normal.  Behavior is cooperative.        Thought Content: Thought content normal.        Judgment: Judgment normal.           Assessment & Plan:  Chronic hepatitis B: We will work this up with checking delta agent checking hepatitis A immunity checking hepatitis C antigen hepatitis C antigen antibody, hepatitis B DNA level, HIV antibody  Have her come back to clinic in a month's time to review test results and of there is not yet an indication for treatment for hepatitis B we will have her follow-up in 6 months with me.  Obesity: would be good to lose weight for her for her  OSA> fortunately no evidence on [place] of NASH  Bilateral lower quadrant pain and tenderness: Not sure what to make of this  I spent greater than 60 minutes with the patient including greater than 50% of time in face to face counsel of the patient and her son regarding the nature of hepatitis B (which he is quite familiar with) which labs we need to do which other viruses we need to make sure that she does not have any coordination of her care.

## 2021-01-31 NOTE — Patient Instructions (Signed)

## 2021-02-04 LAB — HIV ANTIBODY (ROUTINE TESTING W REFLEX): HIV 1&2 Ab, 4th Generation: NONREACTIVE

## 2021-02-04 LAB — HEPATITIS DELTA VIRUS ANTIGEN: Hepatitis D Antigen: NOT DETECTED

## 2021-02-04 LAB — HEPATITIS B DNA, ULTRAQUANTITATIVE, PCR
Hepatitis B DNA (Calc): 1.88 Log IU/mL — ABNORMAL HIGH
Hepatitis B DNA: 75 IU/mL — ABNORMAL HIGH

## 2021-02-04 LAB — HEPATITIS E ANTIBODY, IGG: HEPATITIS E ANTIBODY (IGG): NOT DETECTED

## 2021-02-04 LAB — HEPATITIS B E ANTIGEN: Hep B E Ag: NONREACTIVE

## 2021-02-04 LAB — HEPATITIS A ANTIBODY, TOTAL: Hepatitis A AB,Total: REACTIVE — AB

## 2021-02-07 ENCOUNTER — Telehealth: Payer: Self-pay | Admitting: Nurse Practitioner

## 2021-02-07 ENCOUNTER — Other Ambulatory Visit: Payer: Self-pay | Admitting: Obstetrics & Gynecology

## 2021-02-07 DIAGNOSIS — Z1231 Encounter for screening mammogram for malignant neoplasm of breast: Secondary | ICD-10-CM

## 2021-02-07 NOTE — Telephone Encounter (Signed)
Dawn Mahoney was going to discuss this patient with Dr. Ardis Hughs about scheduling this patient for an Endo/Colon at the hospital. I haven't heard anything about it since then.

## 2021-02-07 NOTE — Telephone Encounter (Signed)
Inbound call from patient son. Wants to know update on scheduling endoscopy and colonoscopy. Asked to please return call.

## 2021-02-07 NOTE — Telephone Encounter (Signed)
This pt saw Nevin Bloodgood please send to her nurse, [place].  Thank you

## 2021-02-07 NOTE — Telephone Encounter (Signed)
Is there something in progress for this procedure and can I help?

## 2021-02-07 NOTE — Telephone Encounter (Signed)
Dawn Mahoney, what is the plan? She was seen in the office. I will need to bring her back again for a pre-visit.  Thanks

## 2021-02-08 NOTE — Telephone Encounter (Signed)
Plan was for EGD / colonoscopy but more than likely we were unable to schedule it at the time of the office visit because Dr. Audelia Acton hospital procedures have to be scheduled through his nurse is what I am told.

## 2021-02-09 ENCOUNTER — Telehealth: Payer: Self-pay

## 2021-02-09 ENCOUNTER — Other Ambulatory Visit: Payer: Self-pay

## 2021-02-09 ENCOUNTER — Ambulatory Visit
Admission: RE | Admit: 2021-02-09 | Discharge: 2021-02-09 | Disposition: A | Discharge status: Home / Self Care | Payer: 59 | Source: Ambulatory Visit

## 2021-02-09 DIAGNOSIS — Z1231 Encounter for screening mammogram for malignant neoplasm of breast: Secondary | ICD-10-CM

## 2021-02-09 NOTE — Telephone Encounter (Signed)
-----   Message from Milus Banister, MD sent at 02/08/2021  7:01 PM EDT ----- Chong Sicilian, Can you help with this. BMI 59, needs colonoscoyp and EGD. My first available time at [facility].  Thanks  Nevin Bloodgood, [place]  ----- Message ----- From: Willia Craze, NP Sent: 02/08/2021   4:44 PM EDT To: Milus Banister, MD  Hi,  Can you give me a day and time for EGD / colonoscopy to be done at for this patient? Thanks

## 2021-02-13 NOTE — Telephone Encounter (Signed)
Called the son of the patient , Sunday. No answer. Left a message on the voicemail offering this date. Case entered into Epic.

## 2021-02-13 NOTE — Telephone Encounter (Signed)
Hey I have 03/30/21 at 730 am at [facility] with Dr Ardis Hughs for this procedure Dawn Mahoney ask for.  I will have them hold It for you.

## 2021-02-14 ENCOUNTER — Other Ambulatory Visit: Payer: Self-pay

## 2021-02-14 DIAGNOSIS — Z13820 Encounter for screening for osteoporosis: Secondary | ICD-10-CM

## 2021-02-14 NOTE — Telephone Encounter (Signed)
Spoke with the son of the patient. Agrees to this appointment. I will mail the instructions. He will review them and call if there are any questions.

## 2021-02-14 NOTE — Telephone Encounter (Signed)
Case posted for 03/30/21 in the hour spot saved at 7:30 am.  Waiting for a call back from the family.

## 2021-02-15 ENCOUNTER — Other Ambulatory Visit: Payer: Self-pay | Admitting: Obstetrics & Gynecology

## 2021-02-15 ENCOUNTER — Other Ambulatory Visit: Payer: Self-pay

## 2021-02-15 DIAGNOSIS — K219 Gastro-esophageal reflux disease without esophagitis: Secondary | ICD-10-CM

## 2021-02-15 DIAGNOSIS — Z1211 Encounter for screening for malignant neoplasm of colon: Secondary | ICD-10-CM

## 2021-02-15 DIAGNOSIS — Z13820 Encounter for screening for osteoporosis: Secondary | ICD-10-CM

## 2021-02-15 DIAGNOSIS — R1012 Left upper quadrant pain: Secondary | ICD-10-CM

## 2021-02-15 DIAGNOSIS — Z780 Asymptomatic menopausal state: Secondary | ICD-10-CM

## 2021-03-03 ENCOUNTER — Encounter: Payer: Self-pay | Admitting: Infectious Disease

## 2021-03-03 ENCOUNTER — Ambulatory Visit (INDEPENDENT_AMBULATORY_CARE_PROVIDER_SITE_OTHER): Payer: 59 | Admitting: Infectious Disease

## 2021-03-03 ENCOUNTER — Other Ambulatory Visit: Payer: Self-pay

## 2021-03-03 VITALS — BP 140/83 | HR 67 | Resp 16 | Ht 59.0 in | Wt 300.4 lb

## 2021-03-03 DIAGNOSIS — Z6841 Body Mass Index (BMI) 40.0 and over, adult: Secondary | ICD-10-CM

## 2021-03-03 DIAGNOSIS — B181 Chronic viral hepatitis B without delta-agent: Secondary | ICD-10-CM

## 2021-03-03 NOTE — Progress Notes (Signed)
Subjective:   Chief complaint follow-up for chronic hepatitis B infection   Patient ID: Dawn Mahoney, female    DOB: 08/09/1956, 65 y.o.   MRN: [id number]  HPI  Dawn Mahoney is a 65 year old [place] lady with a past medical history significant for gallstones hypertension who is experiencing right upper quadrant pain and continues to experience this pain when labs were done to work this up including a comprehensive metabolic panel and hepatitis panel.  Her hepatitis B surface antigen came back positive.  Transaminases were only minimally elevated right upper quadrant ultrasound with normal echogenicity of the liver with no lesions and some gallstones but without evidence of cholecystitis.  Her son who accompanied her to her last visit and this 1 as well.  We checked labs on her  Did not have hepatitis B delta agent her hepatitis B DNA level was 75 copies per IU per mL That hepatitis B E antigen was negative it is E antigen antibody was also negative  Is a antibody was positive indicating likely prior vaccination versus infection.  Hepatitis C antibody is negative HIV test is negative  Past Medical History:  Diagnosis Date  . Arthritis    both knees  . Gallstones 11/2020  . Hepatitis B 01/31/2021  . Hypertension     No past surgical history on file.  Family History  Problem Relation Age of Onset  . Colon cancer Neg Hx        unknown      Social History   Socioeconomic History  . Marital status: Single    Spouse name: Not on file  . Number of children: Not on file  . Years of education: Not on file  . Highest education level: Not on file  Occupational History  . Not on file  Tobacco Use  . Smoking status: Never Smoker  . Smokeless tobacco: Never Used  Substance and Sexual Activity  . Alcohol use: Never  . Drug use: Never  . Sexual activity: Not Currently  Other Topics Concern  . Not on file  Social History Narrative  . Not on file   Social Determinants of  Health   Financial Resource Strain: Not on file  Food Insecurity: Not on file  Transportation Needs: Not on file  Physical Activity: Not on file  Stress: Not on file  Social Connections: Not on file    No Known Allergies   Current Outpatient Medications:  .  acetaminophen (TYLENOL) 325 MG tablet, Take 650 mg by mouth every 6 (six) hours as needed., Disp: , Rfl:  .  lisinopril-hydrochlorothiazide (ZESTORETIC) 20-12.5 MG tablet, Take 1 tablet by mouth daily., Disp: 90 tablet, Rfl: 3 .  omeprazole (PRILOSEC) 20 MG capsule, Take 1 capsule by mouth daily., Disp: , Rfl:    Review of Systems  Constitutional: Negative for activity change, appetite change, chills, diaphoresis, fatigue, fever and unexpected weight change.  HENT: Negative for congestion, rhinorrhea, sinus pressure, sneezing, sore throat and trouble swallowing.   Eyes: Negative for photophobia and visual disturbance.  Respiratory: Negative for cough, chest tightness, shortness of breath, wheezing and stridor.   Cardiovascular: Negative for chest pain, palpitations and leg swelling.  Gastrointestinal: Negative for abdominal distention, anal bleeding, blood in stool, constipation, diarrhea, nausea and vomiting.  Genitourinary: Negative for difficulty urinating, dysuria, flank pain and hematuria.  Musculoskeletal: Negative for arthralgias, back pain, gait problem, joint swelling and myalgias.  Skin: Negative for color change, pallor, rash and wound.  Neurological: Negative for dizziness, tremors, weakness and  light-headedness.  Hematological: Negative for adenopathy. Does not bruise/bleed easily.  Psychiatric/Behavioral: Negative for agitation, behavioral problems, confusion, decreased concentration, dysphoric mood and sleep disturbance.       Objective:   Physical Exam Constitutional:      General: She is not in acute distress.    Appearance: Normal appearance. She is well-developed. She is not ill-appearing or diaphoretic.   HENT:     Head: Normocephalic and atraumatic.     Right Ear: Hearing and external ear normal.     Left Ear: Hearing and external ear normal.     Nose: No nasal deformity or rhinorrhea.  Eyes:     General: No scleral icterus.    Conjunctiva/sclera: Conjunctivae normal.     Right eye: Right conjunctiva is not injected.     Left eye: Left conjunctiva is not injected.     Pupils: Pupils are equal, round, and reactive to light.  Neck:     Vascular: No JVD.  Cardiovascular:     Rate and Rhythm: Normal rate and regular rhythm.     Heart sounds: S1 normal and S2 normal.  Pulmonary:     Effort: Pulmonary effort is normal. No respiratory distress.  Abdominal:     General: Bowel sounds are normal. There is no distension.     Palpations: Abdomen is soft.  Musculoskeletal:        General: Normal range of motion.     Right shoulder: Normal.     Left shoulder: Normal.     Cervical back: Normal range of motion and neck supple.     Right hip: Normal.     Left hip: Normal.     Right knee: Normal.     Left knee: Normal.  Lymphadenopathy:     Head:     Right side of head: No submandibular, preauricular or posterior auricular adenopathy.     Left side of head: No submandibular, preauricular or posterior auricular adenopathy.     Cervical: No cervical adenopathy.     Right cervical: No superficial or deep cervical adenopathy.    Left cervical: No superficial or deep cervical adenopathy.  Skin:    General: Skin is warm and dry.     Coloration: Skin is not pale.     Findings: No abrasion, bruising, ecchymosis, erythema, lesion or rash.     Nails: There is no clubbing.  Neurological:     General: No focal deficit present.     Mental Status: She is alert and oriented to person, place, and time.     Sensory: No sensory deficit.     Coordination: Coordination normal.     Gait: Gait normal.  Psychiatric:        Attention and Perception: She is attentive.        Mood and Affect: Mood normal.         Speech: Speech normal.        Behavior: Behavior normal. Behavior is cooperative.        Thought Content: Thought content normal.        Judgment: Judgment normal.           Assessment & Plan:  Chronic hepatitis B without hepatic coma without delta agent:  We will have her come back in 6 months time to recheck labs and repeat ultrasound screening for hepatocellular carcinoma.    All labs and pertinent radiographic data were reviewed by me personally and also I went over these with the patient and her son.

## 2021-03-06 ENCOUNTER — Telehealth: Payer: Self-pay | Admitting: Nurse Practitioner

## 2021-03-06 NOTE — Telephone Encounter (Signed)
Pt's son Sunday has more questions regarding your last conversation. Pls call him at [phone].

## 2021-03-10 ENCOUNTER — Other Ambulatory Visit: Payer: Self-pay

## 2021-03-10 MED ORDER — PEG 3350-KCL-NA BICARB-NACL 420 G PO SOLR: 4000.0000 mL | Freq: Once | ORAL | 0 refills | Status: AC

## 2021-03-10 NOTE — Telephone Encounter (Signed)
Pt's son called again, he is requesting instructions for pt's EGD that is scheduled on 6/2 at the [facility] hospital. It is ok to send it via [place].

## 2021-03-10 NOTE — Telephone Encounter (Signed)
Instructions created and mailed to the patient.  I spoke with the patient's son.  New prep sent to the pharmacy.  We discussed COVID screen for 5/31.  I sent instructions via [place] as well.

## 2021-03-24 ENCOUNTER — Other Ambulatory Visit: Payer: Self-pay

## 2021-03-24 NOTE — Progress Notes (Signed)
Attempted to obtain medical history via telephone, unable to reach at this time. I left a voicemail to return pre surgical testing department's phone call.  

## 2021-03-28 ENCOUNTER — Other Ambulatory Visit (HOSPITAL_COMMUNITY)
Admission: RE | Admit: 2021-03-28 | Discharge: 2021-03-28 | Disposition: A | Discharge status: Home / Self Care | Payer: 59 | Source: Ambulatory Visit | Attending: Gastroenterology | Admitting: Gastroenterology

## 2021-03-28 DIAGNOSIS — Z20822 Contact with and (suspected) exposure to covid-19: Secondary | ICD-10-CM | POA: Insufficient documentation

## 2021-03-28 DIAGNOSIS — Z01812 Encounter for preprocedural laboratory examination: Secondary | ICD-10-CM | POA: Insufficient documentation

## 2021-03-28 LAB — SARS CORONAVIRUS 2 (TAT 6-24 HRS): SARS Coronavirus 2: NEGATIVE

## 2021-03-29 ENCOUNTER — Encounter (HOSPITAL_COMMUNITY): Payer: Self-pay | Admitting: Gastroenterology

## 2021-03-29 NOTE — Anesthesia Preprocedure Evaluation (Addendum)
Anesthesia Evaluation  Patient identified by MRN, date of birth, ID band Patient awake    Reviewed: Allergy & Precautions, NPO status , Patient's Chart, lab work & pertinent test results, reviewed documented beta blocker date and time   Airway Mallampati: III  TM Distance: >3 FB Neck ROM: Full    Dental  (+) Loose,    Pulmonary sleep apnea and Continuous Positive Airway Pressure Ventilation ,    Pulmonary exam normal breath sounds clear to auscultation       Cardiovascular hypertension, Pt. on medications Normal cardiovascular exam Rhythm:Regular Rate:Normal     Neuro/Psych negative neurological ROS  negative psych ROS   GI/Hepatic GERD  Medicated and Controlled,(+) Hepatitis -, B  Endo/Other  Morbid obesityHypercholesterolemia  Renal/GU negative Renal ROS  negative genitourinary   Musculoskeletal  (+) Arthritis , Osteoarthritis,    Abdominal (+) + obese,   Peds  Hematology negative hematology ROS (+)   Anesthesia Other Findings   Reproductive/Obstetrics                            Anesthesia Physical Anesthesia Plan  ASA: III  Anesthesia Plan: MAC   Post-op Pain Management:    Induction: Intravenous  PONV Risk Score and Plan: 2 and Treatment may vary due to age or medical condition and Propofol infusion  Airway Management Planned: Natural Airway and Nasal Cannula  Additional Equipment:   Intra-op Plan:   Post-operative Plan:   Informed Consent: I have reviewed the patients History and Physical, chart, labs and discussed the procedure including the risks, benefits and alternatives for the proposed anesthesia with the patient or authorized representative who has indicated his/her understanding and acceptance.     Dental advisory given  Plan Discussed with: CRNA and Anesthesiologist  Anesthesia Plan Comments:      Anesthesia Quick Evaluation

## 2021-03-30 ENCOUNTER — Encounter (HOSPITAL_COMMUNITY): Payer: Self-pay | Admitting: Gastroenterology

## 2021-03-30 ENCOUNTER — Ambulatory Visit (HOSPITAL_COMMUNITY): Payer: 59 | Admitting: Anesthesiology

## 2021-03-30 ENCOUNTER — Other Ambulatory Visit: Payer: Self-pay

## 2021-03-30 ENCOUNTER — Encounter (HOSPITAL_COMMUNITY)
Admission: RE | Disposition: A | Discharge status: Home / Self Care | Payer: Self-pay | Source: Home / Self Care | Attending: Gastroenterology

## 2021-03-30 ENCOUNTER — Ambulatory Visit (HOSPITAL_COMMUNITY)
Admission: RE | Admit: 2021-03-30 | Discharge: 2021-03-30 | Disposition: A | Discharge status: Home / Self Care | Payer: 59 | Attending: Gastroenterology | Admitting: Gastroenterology

## 2021-03-30 DIAGNOSIS — Z6841 Body Mass Index (BMI) 40.0 and over, adult: Secondary | ICD-10-CM | POA: Diagnosis not present

## 2021-03-30 DIAGNOSIS — I10 Essential (primary) hypertension: Secondary | ICD-10-CM | POA: Insufficient documentation

## 2021-03-30 DIAGNOSIS — K635 Polyp of colon: Secondary | ICD-10-CM | POA: Diagnosis not present

## 2021-03-30 DIAGNOSIS — B1910 Unspecified viral hepatitis B without hepatic coma: Secondary | ICD-10-CM | POA: Insufficient documentation

## 2021-03-30 DIAGNOSIS — Z1211 Encounter for screening for malignant neoplasm of colon: Secondary | ICD-10-CM | POA: Insufficient documentation

## 2021-03-30 DIAGNOSIS — R1012 Left upper quadrant pain: Secondary | ICD-10-CM | POA: Insufficient documentation

## 2021-03-30 DIAGNOSIS — R1011 Right upper quadrant pain: Secondary | ICD-10-CM | POA: Insufficient documentation

## 2021-03-30 DIAGNOSIS — K2970 Gastritis, unspecified, without bleeding: Secondary | ICD-10-CM | POA: Diagnosis not present

## 2021-03-30 DIAGNOSIS — D122 Benign neoplasm of ascending colon: Secondary | ICD-10-CM | POA: Diagnosis not present

## 2021-03-30 DIAGNOSIS — K2950 Unspecified chronic gastritis without bleeding: Secondary | ICD-10-CM | POA: Diagnosis not present

## 2021-03-30 HISTORY — PX: POLYPECTOMY: SHX5525

## 2021-03-30 HISTORY — PX: BIOPSY: SHX5522

## 2021-03-30 HISTORY — PX: COLONOSCOPY WITH PROPOFOL: SHX5780

## 2021-03-30 HISTORY — PX: ESOPHAGOGASTRODUODENOSCOPY (EGD) WITH PROPOFOL: SHX5813

## 2021-03-30 SURGERY — ESOPHAGOGASTRODUODENOSCOPY (EGD) WITH PROPOFOL
Anesthesia: Monitor Anesthesia Care

## 2021-03-30 MED ORDER — PROPOFOL 500 MG/50ML IV EMUL
INTRAVENOUS | Status: DC | PRN
  Administered 2021-03-30: 100 ug/kg/min via INTRAVENOUS

## 2021-03-30 MED ORDER — PROPOFOL 10 MG/ML IV BOLUS
INTRAVENOUS | Status: DC | PRN
  Administered 2021-03-30: 70 mg via INTRAVENOUS
  Administered 2021-03-30: 30 mg via INTRAVENOUS

## 2021-03-30 MED ORDER — SODIUM CHLORIDE 0.9 % IV SOLN: INTRAVENOUS | Status: DC

## 2021-03-30 MED ORDER — LIDOCAINE 2% (20 MG/ML) 5 ML SYRINGE
INTRAMUSCULAR | Status: DC | PRN
  Administered 2021-03-30 (×2): 50 mg via INTRAVENOUS

## 2021-03-30 MED ORDER — LACTATED RINGERS IV SOLN
INTRAVENOUS | Status: AC | PRN
  Administered 2021-03-30: 1000 mL via INTRAVENOUS

## 2021-03-30 SURGICAL SUPPLY — 24 items

## 2021-03-30 NOTE — Anesthesia Postprocedure Evaluation (Signed)
Anesthesia Post Note  Patient: Dawn Mahoney  Procedure(s) Performed: ESOPHAGOGASTRODUODENOSCOPY (EGD) WITH PROPOFOL (N/A ) COLONOSCOPY WITH PROPOFOL (N/A ) POLYPECTOMY BIOPSY     Patient location during evaluation: PACU Anesthesia Type: MAC Level of consciousness: awake and alert Pain management: pain level controlled Vital Signs Assessment: post-procedure vital signs reviewed and stable Respiratory status: spontaneous breathing, nonlabored ventilation and respiratory function stable Cardiovascular status: stable and blood pressure returned to baseline Postop Assessment: no apparent nausea or vomiting Anesthetic complications: no   No complications documented.  Last Vitals:  Vitals:   03/30/21 0840 03/30/21 0852  BP: (!) 175/78 (!) 183/90  Pulse: 72 72  Resp: (!) 21 20  Temp:    SpO2: 96% 93%    Last Pain:  Vitals:   03/30/21 0852  TempSrc:   PainSc: 0-No pain                 [name]

## 2021-03-30 NOTE — Op Note (Signed)
[facility] Patient Name: Dawn Mahoney Procedure Date: 03/30/2021 MRN: [id number] Attending MD: Milus Banister , MD Date of Birth: 12-Apr-1956 CSN: [id number] Age: 65 Admit Type: Outpatient Procedure:                Upper GI endoscopy Indications:              Abdominal pain in the right upper quadrant,                            Abdominal pain in the left upper quadrant Providers:                Milus Banister, MD, Nelia Shi, RN, Cherylynn Ridges, Technician, Heide Scales, CRNA Referring MD:              Medicines:                Monitored Anesthesia Care Complications:            No immediate complications. Estimated blood loss:                            None. Estimated Blood Loss:     Estimated blood loss: none. Procedure:                Pre-Anesthesia Assessment:                           - Prior to the procedure, a History and Physical                            was performed, and patient medications and                            allergies were reviewed. The patient's tolerance of                            previous anesthesia was also reviewed. The risks                            and benefits of the procedure and the sedation                            options and risks were discussed with the patient.                            All questions were answered, and informed consent                            was obtained. Prior Anticoagulants: The patient has                            taken no previous anticoagulant or antiplatelet  agents. ASA Grade Assessment: IV - A patient with                            severe systemic disease that is a constant threat                            to life. After reviewing the risks and benefits,                            the patient was deemed in satisfactory condition to                            undergo the procedure.                           After obtaining  informed consent, the endoscope was                            passed under direct vision. Throughout the                            procedure, the patient's blood pressure, pulse, and                            oxygen saturations were monitored continuously. The                            [id number]-H190 ([id number] Olympus gastroscope was                            introduced through the mouth, and advanced to the                            second part of duodenum. The upper GI endoscopy was                            accomplished without difficulty. The patient                            tolerated the procedure well. Scope In: Scope Out: Findings:      Mild inflammation characterized by erythema and granularity was found in       the entire examined stomach. Biopsies were taken with a cold forceps for       histology.      The exam was otherwise without abnormality. Impression:               - Non-specific pan-gastritis. Biopsied.                           - The examination was otherwise normal. Moderate Sedation:      Not Applicable - Patient had care per Anesthesia. Recommendation:           - Patient has a contact number available for  emergencies. The signs and symptoms of potential                            delayed complications were discussed with the                            patient. Return to normal activities tomorrow.                            Written discharge instructions were provided to the                            patient.                           - Resume previous diet.                           - Continue present medications.                           - Await pathology results. Procedure Code(s):        --- Professional ---                           [phone], Esophagogastroduodenoscopy, flexible,                            transoral; with biopsy, single or multiple Diagnosis Code(s):        --- Professional ---                            K29.70, Gastritis, unspecified, without bleeding                           R10.11, Right upper quadrant pain                           R10.12, Left upper quadrant pain CPT copyright 2019 American Medical Association. All rights reserved. The codes documented in this report are preliminary and upon coder review may  be revised to meet current compliance requirements. Milus Banister, MD 03/30/2021 8:40:42 AM This report has been signed electronically. Number of Addenda: 0

## 2021-03-30 NOTE — Transfer of Care (Signed)
Immediate Anesthesia Transfer of Care Note  Patient: Dawn Mahoney  Procedure(s) Performed: Procedure(s): ESOPHAGOGASTRODUODENOSCOPY (EGD) WITH PROPOFOL (N/A) COLONOSCOPY WITH PROPOFOL (N/A) POLYPECTOMY BIOPSY  Patient Location: PACU  Anesthesia Type:MAC  Level of Consciousness: Patient easily awoken, sedated, comfortable, cooperative, following commands, responds to stimulation.   Airway & Oxygen Therapy: Patient spontaneously breathing, ventilating well, oxygen via simple oxygen mask.  Post-op Assessment: Report given to PACU RN, vital signs reviewed and stable, moving all extremities.   Post vital signs: Reviewed and stable.  Complications: No apparent anesthesia complications  Last Vitals:  Vitals Value Taken Time  BP 139/82 03/30/21 0822  Temp    Pulse 75 03/30/21 0826  Resp 28 03/30/21 0826  SpO2 100 % 03/30/21 0826  Vitals shown include unvalidated device data.  Last Pain:  Vitals:   03/30/21 0710  TempSrc: Oral  PainSc: 0-No pain         Complications: No complications documented.

## 2021-03-30 NOTE — Discharge Instructions (Signed)
YOU HAD AN ENDOSCOPIC PROCEDURE TODAY: Refer to the procedure report and other information in the discharge instructions given to you for any specific questions about what was found during the examination. If this information does not answer your questions, please call [facility] office at [phone] to clarify.   YOU SHOULD EXPECT: Some feelings of bloating in the abdomen. Passage of more gas than usual. Walking can help get rid of the air that was put into your GI tract during the procedure and reduce the bloating. If you had a lower endoscopy (such as a colonoscopy or flexible sigmoidoscopy) you may notice spotting of blood in your stool or on the toilet paper. Some abdominal soreness may be present for a day or two, also.  DIET: Your first meal following the procedure should be a light meal and then it is ok to progress to your normal diet. A half-sandwich or bowl of soup is an example of a good first meal. Heavy or fried foods are harder to digest and may make you feel nauseous or bloated. Drink plenty of fluids but you should avoid alcoholic beverages for 24 hours.    ACTIVITY: Your care partner should take you home directly after the procedure. You should plan to take it easy, moving slowly for the rest of the day. You can resume normal activity the day after the procedure however YOU SHOULD NOT DRIVE, use power tools, machinery or perform tasks that involve climbing or major physical exertion for 24 hours (because of the sedation medicines used during the test).   SYMPTOMS TO REPORT IMMEDIATELY: A gastroenterologist can be reached at any hour. Please call [phone]  for any of the following symptoms:  . Following lower endoscopy (colonoscopy, flexible sigmoidoscopy) Excessive amounts of blood in the stool  Significant tenderness, worsening of abdominal pains  Swelling of the abdomen that is new, acute  Fever of 100 or higher  . Following upper endoscopy (EGD, EUS, ERCP, esophageal  dilation) Vomiting of blood or coffee ground material  New, significant abdominal pain  New, significant chest pain or pain under the shoulder blades  Painful or persistently difficult swallowing  New shortness of breath  Black, tarry-looking or red, bloody stools  FOLLOW UP:  If any biopsies were taken you will be contacted by phone or by letter within the next 1-3 weeks. Call [phone]  if you have not heard about the biopsies in 3 weeks.  Please also call with any specific questions about appointments or follow up tests.  

## 2021-03-30 NOTE — Anesthesia Postprocedure Evaluation (Deleted)
Anesthesia Post Note  Patient: Jamesia Marandola  Procedure(s) Performed: ESOPHAGOGASTRODUODENOSCOPY (EGD) WITH PROPOFOL (N/A ) COLONOSCOPY WITH PROPOFOL (N/A ) POLYPECTOMY BIOPSY     Patient location during evaluation: PACU Anesthesia Type: MAC Level of consciousness: awake and alert and oriented Pain management: pain level controlled Vital Signs Assessment: post-procedure vital signs reviewed and stable Respiratory status: spontaneous breathing, nonlabored ventilation and respiratory function stable Cardiovascular status: stable and blood pressure returned to baseline Postop Assessment: no apparent nausea or vomiting Anesthetic complications: no   No complications documented.  Last Vitals:  Vitals:   03/30/21 0710  BP: (!) 177/71  Pulse: 64  Resp: 19  Temp: 37.1 C  SpO2: 96%    Last Pain:  Vitals:   03/30/21 0710  TempSrc: Oral  PainSc: 0-No pain                 Danica Camarena A.

## 2021-03-30 NOTE — H&P (Signed)
  HPI: This is a woman with chronic abd pains  ROS: complete GI ROS as described in HPI, all other review negative.  Constitutional:  No unintentional weight loss   Past Medical History:  Diagnosis Date  . Arthritis    both knees  . Gallstones 11/2020  . Hepatitis B 01/31/2021  . Hypertension     History reviewed. No pertinent surgical history.  No current facility-administered medications for this encounter.    Allergies as of 02/13/2021  . (No Known Allergies)    Family History  Problem Relation Age of Onset  . Colon cancer Neg Hx        unknown    Social History   Socioeconomic History  . Marital status: Single    Spouse name: Not on file  . Number of children: Not on file  . Years of education: Not on file  . Highest education level: Not on file  Occupational History  . Not on file  Tobacco Use  . Smoking status: Never Smoker  . Smokeless tobacco: Never Used  Substance and Sexual Activity  . Alcohol use: Never  . Drug use: Never  . Sexual activity: Not Currently  Other Topics Concern  . Not on file  Social History Narrative  . Not on file   Social Determinants of Health   Financial Resource Strain: Not on file  Food Insecurity: Not on file  Transportation Needs: Not on file  Physical Activity: Not on file  Stress: Not on file  Social Connections: Not on file  Intimate Partner Violence: Not on file     Physical Exam: BP (!) 177/71   Pulse 64   Temp 98.8 F (37.1 C) (Oral)   Resp 19   Ht 5\' 1"  (1.549 m)   Wt (!) 136.1 kg   SpO2 96%   BMI 56.69 kg/m  Constitutional: generally well-appearing Psychiatric: alert and oriented x3 Abdomen: soft, nontender, nondistended, no obvious ascites, no peritoneal signs, normal bowel sounds No peripheral edema noted in lower extremities  Assessment and plan: 65 y.o. female with chronic abd pains, morbid obesity, routine risk for colon cancer  For EGD and colonoscopy today  Please see the "Patient  Instructions" section for addition details about the plan.  Owens Loffler, MD [facility] 03/30/2021, 7:16 AM

## 2021-03-30 NOTE — Anesthesia Procedure Notes (Signed)
Procedure Name: MAC Date/Time: 03/30/2021 7:45 AM Performed by: Deliah Boston, CRNA Pre-anesthesia Checklist: Patient identified, Emergency Drugs available, Suction available and Patient being monitored Patient Re-evaluated:Patient Re-evaluated prior to induction Oxygen Delivery Method: Simple face mask Preoxygenation: Pre-oxygenation with 100% oxygen Induction Type: IV induction Placement Confirmation: breath sounds checked- equal and bilateral and positive ETCO2

## 2021-03-30 NOTE — Op Note (Signed)
[facility] Patient Name: Dawn Mahoney Procedure Date: 03/30/2021 MRN: [id number] Attending MD: Milus Banister , MD Date of Birth: 23-Feb-1956 CSN: [id number] Age: 65 Admit Type: Outpatient Procedure:                Colonoscopy Indications:              Screening for colorectal malignant neoplasm Providers:                Milus Banister, MD, Nelia Shi, RN, Cherylynn Ridges, Technician, Heide Scales, CRNA Referring MD:              Medicines:                Monitored Anesthesia Care Complications:            No immediate complications. Estimated blood loss:                            None. Estimated Blood Loss:     Estimated blood loss: none. Procedure:                Pre-Anesthesia Assessment:                           - Prior to the procedure, a History and Physical                            was performed, and patient medications and                            allergies were reviewed. The patient's tolerance of                            previous anesthesia was also reviewed. The risks                            and benefits of the procedure and the sedation                            options and risks were discussed with the patient.                            All questions were answered, and informed consent                            was obtained. Prior Anticoagulants: The patient has                            taken no previous anticoagulant or antiplatelet                            agents. ASA Grade Assessment: IV - A patient with  severe systemic disease that is a constant threat                            to life. After reviewing the risks and benefits,                            the patient was deemed in satisfactory condition to                            undergo the procedure.                           After obtaining informed consent, the colonoscope                            was passed  under direct vision. Throughout the                            procedure, the patient's blood pressure, pulse, and                            oxygen saturations were monitored continuously. The                            CF-[id number] ([id number] Olympus colonoscope was                            introduced through the anus and advanced to the the                            cecum, identified by appendiceal orifice and                            ileocecal valve. The colonoscopy was performed                            without difficulty. The patient tolerated the                            procedure well. The quality of the bowel                            preparation was good. The ileocecal valve,                            appendiceal orifice, and rectum were photographed. Scope In: 7:46:06 AM Scope Out: 7:59:14 AM Total Procedure Duration: 0 hours 13 minutes 7 seconds  Findings:      Two sessile polyps were found in the ascending colon. The polyps were 3       to 9 mm in size. These polyps were removed with a cold snare. Resection       and retrieval were complete.      The exam was otherwise without abnormality on direct and retroflexion       views. Impression:               -  Two 3 to 9 mm polyps in the ascending colon,                            removed with a cold snare. Resected and retrieved.                           - The examination was otherwise normal on direct                            and retroflexion views. Moderate Sedation:      Not Applicable - Patient had care per Anesthesia. Recommendation:           - Await pathology results.                           - EGD now. Procedure Code(s):        --- Professional ---                           [id number], Colonoscopy, flexible; with removal of                            tumor(s), polyp(s), or other lesion(s) by snare                            technique Diagnosis Code(s):        --- Professional ---                            Z12.11, Encounter for screening for malignant                            neoplasm of colon                           K63.5, Polyp of colon CPT copyright 2019 American [employer] Association. All rights reserved. The codes documented in this report are preliminary and upon coder review may  be revised to meet current compliance requirements. Milus Banister, MD 03/30/2021 8:01:18 AM This report has been signed electronically. Number of Addenda: 0

## 2021-03-31 ENCOUNTER — Encounter (HOSPITAL_COMMUNITY): Payer: Self-pay | Admitting: Gastroenterology

## 2021-04-03 LAB — SURGICAL PATHOLOGY

## 2021-04-04 ENCOUNTER — Other Ambulatory Visit: Payer: Self-pay

## 2021-04-04 DIAGNOSIS — A048 Other specified bacterial intestinal infections: Secondary | ICD-10-CM

## 2021-04-06 ENCOUNTER — Other Ambulatory Visit: Payer: Self-pay

## 2021-04-06 ENCOUNTER — Encounter: Payer: Self-pay | Admitting: Family Medicine

## 2021-04-06 ENCOUNTER — Ambulatory Visit (INDEPENDENT_AMBULATORY_CARE_PROVIDER_SITE_OTHER): Payer: 59 | Admitting: Family Medicine

## 2021-04-06 VITALS — BP 167/84 | HR 83 | Ht 61.0 in | Wt 300.7 lb

## 2021-04-06 DIAGNOSIS — Z1152 Encounter for screening for COVID-19: Secondary | ICD-10-CM | POA: Diagnosis not present

## 2021-04-06 DIAGNOSIS — I10 Essential (primary) hypertension: Secondary | ICD-10-CM | POA: Diagnosis not present

## 2021-04-06 MED ORDER — OMEPRAZOLE 20 MG PO CPDR
20.0000 mg | DELAYED_RELEASE_CAPSULE | Freq: Every day | ORAL | 1 refills | Status: DC
Start: 2021-04-06 — End: 2021-07-11

## 2021-04-06 MED ORDER — LISINOPRIL-HYDROCHLOROTHIAZIDE 20-12.5 MG PO TABS: 2.0000 | ORAL_TABLET | Freq: Every day | ORAL | 3 refills | Status: DC

## 2021-04-06 NOTE — Progress Notes (Signed)
Established Patient Office Visit  SUBJECTIVE:  Subjective  Patient ID: Dawn Mahoney, female    DOB: 1956/01/28  Age: 65 y.o. MRN: [id number]  CC:  Chief Complaint  Patient presents with   Follow-up    3 month general check up, no complaints today.    HPI Dawn Mahoney is a 65 y.o. female presenting today for     Past Medical History:  Diagnosis Date   Arthritis    both knees   Gallstones 11/2020   Hepatitis B 01/31/2021   Hypertension     Past Surgical History:  Procedure Laterality Date   BIOPSY  03/30/2021   Procedure: BIOPSY;  Surgeon: Milus Banister, MD;  Location: [facility];  Service: Endoscopy;;   COLONOSCOPY WITH PROPOFOL N/A 03/30/2021   Procedure: COLONOSCOPY WITH PROPOFOL;  Surgeon: Milus Banister, MD;  [facility];  Service: Endoscopy;  Laterality: N/A;   ESOPHAGOGASTRODUODENOSCOPY (EGD) WITH PROPOFOL N/A 03/30/2021   Procedure: ESOPHAGOGASTRODUODENOSCOPY (EGD) WITH PROPOFOL;  Surgeon: Milus Banister, MD;  [facility];  Service: Endoscopy;  Laterality: N/A;   POLYPECTOMY  03/30/2021   Procedure: POLYPECTOMY;  Surgeon: Milus Banister, MD;  [facility];  Service: Endoscopy;;    Family History  Problem Relation Age of Onset   Colon cancer Neg Hx        unknown    Social History   Socioeconomic History   Marital status: Single    Spouse name: Not on file   Number of children: Not on file   Years of education: Not on file   Highest education level: Not on file  Occupational History   Not on file  Tobacco Use   Smoking status: Never   Smokeless tobacco: Never  Substance and Sexual Activity   Alcohol use: Never   Drug use: Never   Sexual activity: Not Currently  Other Topics Concern   Not on file  Social History Narrative   Not on file   Social Determinants of Health   Financial Resource Strain: Not on file  Food Insecurity: Not on file  Transportation Needs: Not on file  Physical Activity: Not on file   Stress: Not on file  Social Connections: Not on file  Intimate Partner Violence: Not on file     Current Outpatient Medications:    acetaminophen (TYLENOL) 325 MG tablet, Take 650 mg by mouth every 6 (six) hours as needed for moderate pain., Disp: , Rfl:    lisinopril-hydrochlorothiazide (ZESTORETIC) 20-12.5 MG tablet, Take 2 tablets by mouth daily., Disp: 180 tablet, Rfl: 3   Menthol-Methyl Salicylate (MUSCLE RUB) 10-15 % CREA, Apply 1 application topically as needed for muscle pain., Disp: , Rfl:    Multiple Vitamins-Minerals (MULTIVITAMIN ADULT) CHEW, Chew 2 each by mouth daily., Disp: , Rfl:    omeprazole (PRILOSEC) 20 MG capsule, Take 1 capsule (20 mg total) by mouth daily., Disp: 90 capsule, Rfl: 1   No Known Allergies  ROS Review of Systems  Constitutional: Negative.   HENT: Negative.    Respiratory: Negative.    Cardiovascular: Negative.   Neurological: Negative.   Psychiatric/Behavioral: Negative.      OBJECTIVE:    Physical Exam Vitals and nursing note reviewed.  HENT:     Head: Normocephalic.     Nose: Nose normal.     Mouth/Throat:     Mouth: Mucous membranes are moist.  Cardiovascular:     Rate and Rhythm: Normal rate and regular rhythm.  Musculoskeletal:  General: Normal range of motion.  Skin:    General: Skin is warm.  Neurological:     Mental Status: She is alert.  Psychiatric:        Mood and Affect: Mood normal.    BP (!) 167/84   Pulse 83   Ht 5' 1"  (1.549 m)   Wt (!) 300 lb 11.2 oz (136.4 kg)   BMI 56.82 kg/m  Wt Readings from Last 3 Encounters:  04/06/21 (!) 300 lb 11.2 oz (136.4 kg)  03/30/21 (!) 300 lb 0.7 oz (136.1 kg)  03/03/21 (!) 300 lb 6.4 oz (136.3 kg)    Health Maintenance Due  Topic Date Due   TETANUS/TDAP  Never done   Zoster Vaccines- Shingrix (1 of 2) Never done   DEXA SCAN  Never done   PNA vac Low Risk Adult (1 of 2 - PCV13) Never done   COVID-19 Vaccine (3 - Booster) 02/24/2021    There are no preventive  care reminders to display for this patient.  CBC Latest Ref Rng & Units 11/30/2020  WBC 3.8 - 10.8 Thousand/uL 7.0  Hemoglobin 11.7 - 15.5 g/dL 14.6  Hematocrit 35.0 - 45.0 % 43.3  Platelets 140 - 400 Thousand/uL 299   CMP Latest Ref Rng & Units 11/30/2020  Glucose 65 - 99 mg/dL 82  BUN 7 - 25 mg/dL 12  Creatinine 0.50 - 0.99 mg/dL 0.75  Sodium 135 - 146 mmol/L 140  Potassium 3.5 - 5.3 mmol/L 4.8  Chloride 98 - 110 mmol/L 104  CO2 20 - 32 mmol/L 23  Calcium 8.6 - 10.4 mg/dL 9.5  Total Protein 6.1 - 8.1 g/dL 9.0(H)  Total Bilirubin 0.2 - 1.2 mg/dL 0.4  AST 10 - 35 U/L 46(H)  ALT 6 - 29 U/L 40(H)    Lab Results  Component Value Date   TSH 4.43 11/30/2020   No results found for: ALBUMIN, ANIONGAP, EGFR, GFR Lab Results  Component Value Date   CHOL 218 (H) 11/30/2020   HDL 62 11/30/2020   LDLCALC 138 (H) 11/30/2020   CHOLHDL 3.5 11/30/2020   Lab Results  Component Value Date   TRIG 83 11/30/2020   No results found for: HGBA1C    ASSESSMENT & PLAN:   Problem List Items Addressed This Visit       Cardiovascular and Mediastinum   Essential hypertension - Primary    Patient's blood pressure is not within the desired range.  An office visit is recommended. Medication side effects include: no side effects noted Continue current medications.   BP not at goal, increase Lisinopril 20-12.5 to two tabs daily to make 40 -25 mg. Fu 2 months.         Relevant Medications   lisinopril-hydrochlorothiazide (ZESTORETIC) 20-12.5 MG tablet     Other   Encounter for screening for COVID-19   Relevant Medications   lisinopril-hydrochlorothiazide (ZESTORETIC) 20-12.5 MG tablet    Meds ordered this encounter  Medications   omeprazole (PRILOSEC) 20 MG capsule    Sig: Take 1 capsule (20 mg total) by mouth daily.    Dispense:  90 capsule    Refill:  1   lisinopril-hydrochlorothiazide (ZESTORETIC) 20-12.5 MG tablet    Sig: Take 2 tablets by mouth daily.    Dispense:  180 tablet     Refill:  3      Follow-up: No follow-ups on file.    Beckie Salts, [place] [address], [place], [place] [address]

## 2021-04-06 NOTE — Assessment & Plan Note (Signed)
Patient's blood pressure is not within the desired range.  An office visit is recommended. Medication side effects include: no side effects noted Continue current medications.   BP not at goal, increase Lisinopril 20-12.5 to two tabs daily to make 40 -25 mg. Fu 2 months.

## 2021-04-10 ENCOUNTER — Other Ambulatory Visit: Payer: 59

## 2021-04-10 DIAGNOSIS — A048 Other specified bacterial intestinal infections: Secondary | ICD-10-CM

## 2021-04-11 LAB — HELICOBACTER PYLORI  SPECIAL ANTIGEN
MICRO NUMBER:: 12000147
RESULT:: DETECTED — AB
SPECIMEN QUALITY: ADEQUATE

## 2021-04-17 ENCOUNTER — Other Ambulatory Visit: Payer: Self-pay

## 2021-04-17 MED ORDER — BIS SUBCIT-METRONID-TETRACYC 140-125-125 MG PO CAPS: 3.0000 | ORAL_CAPSULE | Freq: Three times a day (TID) | ORAL | 0 refills | Status: DC

## 2021-04-17 MED ORDER — OMEPRAZOLE 20 MG PO CPDR: 20.0000 mg | DELAYED_RELEASE_CAPSULE | Freq: Two times a day (BID) | ORAL | 0 refills | Status: DC

## 2021-04-19 ENCOUNTER — Other Ambulatory Visit: Payer: Self-pay

## 2021-04-19 MED ORDER — DOXYCYCLINE HYCLATE 100 MG PO TABS: 100.0000 mg | ORAL_TABLET | Freq: Two times a day (BID) | ORAL | 0 refills | Status: AC

## 2021-04-19 MED ORDER — BISMUTH SUBSALICYLATE 262 MG PO CHEW: 524.0000 mg | CHEWABLE_TABLET | Freq: Four times a day (QID) | ORAL | 0 refills | Status: AC

## 2021-04-19 MED ORDER — METRONIDAZOLE 250 MG PO TABS: 250.0000 mg | ORAL_TABLET | Freq: Four times a day (QID) | ORAL | 0 refills | Status: AC

## 2021-04-19 NOTE — Progress Notes (Signed)
Rx for flagyl, doxycycline, pepto-bismol sent to pharmacy.

## 2021-05-04 ENCOUNTER — Other Ambulatory Visit: Payer: Self-pay

## 2021-05-04 ENCOUNTER — Encounter: Payer: Self-pay | Admitting: Family Medicine

## 2021-05-04 DIAGNOSIS — Z1152 Encounter for screening for COVID-19: Secondary | ICD-10-CM

## 2021-05-04 MED ORDER — LISINOPRIL-HYDROCHLOROTHIAZIDE 20-12.5 MG PO TABS: 2.0000 | ORAL_TABLET | Freq: Every day | ORAL | 3 refills | Status: DC

## 2021-06-09 ENCOUNTER — Ambulatory Visit: Payer: 59 | Admitting: Family Medicine

## 2021-06-27 ENCOUNTER — Encounter (HOSPITAL_COMMUNITY): Payer: Self-pay | Admitting: *Deleted

## 2021-06-27 ENCOUNTER — Emergency Department (HOSPITAL_COMMUNITY)
Admission: EM | Admit: 2021-06-27 | Discharge: 2021-06-27 | Disposition: A | Discharge status: Home / Self Care | Payer: 59 | Attending: Emergency Medicine | Admitting: Emergency Medicine

## 2021-06-27 DIAGNOSIS — K047 Periapical abscess without sinus: Secondary | ICD-10-CM | POA: Insufficient documentation

## 2021-06-27 DIAGNOSIS — I10 Essential (primary) hypertension: Secondary | ICD-10-CM | POA: Diagnosis not present

## 2021-06-27 DIAGNOSIS — K0889 Other specified disorders of teeth and supporting structures: Secondary | ICD-10-CM | POA: Diagnosis present

## 2021-06-27 DIAGNOSIS — Z79899 Other long term (current) drug therapy: Secondary | ICD-10-CM | POA: Diagnosis not present

## 2021-06-27 MED ORDER — OXYCODONE-ACETAMINOPHEN 5-325 MG PO TABS
1.0000 | ORAL_TABLET | Freq: Once | ORAL | Status: AC
  Administered 2021-06-27: 1 via ORAL
  Filled 2021-06-27: qty 1

## 2021-06-27 MED ORDER — PENICILLIN V POTASSIUM 500 MG PO TABS: 500.0000 mg | ORAL_TABLET | Freq: Four times a day (QID) | ORAL | 0 refills | Status: AC

## 2021-06-27 MED ORDER — OXYCODONE-ACETAMINOPHEN 5-325 MG PO TABS: 1.0000 | ORAL_TABLET | ORAL | 0 refills | Status: DC | PRN

## 2021-06-27 MED ORDER — PENICILLIN V POTASSIUM 500 MG PO TABS
500.0000 mg | ORAL_TABLET | Freq: Once | ORAL | Status: AC
  Administered 2021-06-27: 500 mg via ORAL
  Filled 2021-06-27: qty 1

## 2021-06-27 NOTE — ED Provider Notes (Signed)
[facility] [facility] DEPT Provider Note   CSN: [id number] Arrival date & time: 06/27/21  [id number]     History Chief Complaint  Patient presents with   Facial Swelling    Dawn Mahoney is a 65 y.o. female.  HPI She presents for evaluation of mouth and tooth pain for 2 days.  She is not speaking and defers to a failure with her to answer questions.  She is complaining of swelling in the right cheek area and difficulty speaking or opening her mouth because of the pain.  No reported fever, cough, shortness of breath, weakness or dizziness.  There are no other known modifying factors.    Past Medical History:  Diagnosis Date   Arthritis    both knees   Gallstones 11/2020   Hepatitis B 01/31/2021   Hypertension     Patient Active Problem List   Diagnosis Date Noted   Hepatitis B 01/31/2021   Encounter for screening for COVID-19 11/30/2020   Essential hypertension 11/30/2020   Class 3 severe obesity due to excess calories without serious comorbidity with body mass index (BMI) of 50.0 to 59.9 in adult [facility]) 11/30/2020   OSA (obstructive sleep apnea) 11/30/2020    Past Surgical History:  Procedure Laterality Date   BIOPSY  03/30/2021   Procedure: BIOPSY;  Surgeon: Milus Banister, MD;  [facility];  Service: Endoscopy;;   COLONOSCOPY WITH PROPOFOL N/A 03/30/2021   Procedure: COLONOSCOPY WITH PROPOFOL;  Surgeon: Milus Banister, MD;  [facility];  Service: Endoscopy;  Laterality: N/A;   ESOPHAGOGASTRODUODENOSCOPY (EGD) WITH PROPOFOL N/A 03/30/2021   Procedure: ESOPHAGOGASTRODUODENOSCOPY (EGD) WITH PROPOFOL;  Surgeon: Milus Banister, MD;  [facility];  Service: Endoscopy;  Laterality: N/A;   POLYPECTOMY  03/30/2021   Procedure: POLYPECTOMY;  Surgeon: Milus Banister, MD;  [facility];  Service: Endoscopy;;     OB History     Gravida  9   Para  8   Term  8   Preterm      AB  1   Living  8      SAB       IAB  1   Ectopic      Multiple      Live Births  8           Family History  Problem Relation Age of Onset   Colon cancer Neg Hx        unknown    Social History   Tobacco Use   Smoking status: Never   Smokeless tobacco: Never  Substance Use Topics   Alcohol use: Never   Drug use: Never    Home Medications Prior to Admission medications   Medication Sig Start Date End Date Taking? Authorizing Provider  oxyCODONE-acetaminophen (PERCOCET) 5-325 MG tablet Take 1 tablet by mouth every 4 (four) hours as needed for severe pain or moderate pain. 06/27/21  Yes Daleen Bo, MD  penicillin v potassium (VEETID) 500 MG tablet Take 1 tablet (500 mg total) by mouth 4 (four) times daily for 7 days. 06/27/21 07/04/21 Yes Daleen Bo, MD  acetaminophen (TYLENOL) 325 MG tablet Take 650 mg by mouth every 6 (six) hours as needed for moderate pain.    [provider]  lisinopril-hydrochlorothiazide (ZESTORETIC) 20-12.5 MG tablet Take 2 tablets by mouth daily. 05/04/21   Cletis Athens, MD  Menthol-Methyl Salicylate (MUSCLE RUB) 10-15 % CREA Apply 1 application topically as needed for muscle pain.    [provider]  Multiple Vitamins-Minerals (MULTIVITAMIN ADULT) CHEW Chew 2 each by mouth daily.    [provider]  omeprazole (PRILOSEC) 20 MG capsule Take 1 capsule (20 mg total) by mouth daily. 04/06/21   Beckie Salts, FNP  omeprazole (PRILOSEC) 20 MG capsule Take 1 capsule (20 mg total) by mouth 2 (two) times daily before a meal. 04/17/21   Milus Banister, MD    Allergies    Patient has no known allergies.  Review of Systems   Review of Systems  All other systems reviewed and are negative.  Physical Exam Updated Vital Signs BP (!) 159/108 (BP Location: Left Arm)   Pulse 72   Temp 98 F (36.7 C) (Oral)   Resp 19   SpO2 96%   Physical Exam Vitals and nursing note reviewed.  Constitutional:      General: She is not in acute distress.    Appearance:  She is well-developed. She is obese. She is not ill-appearing or diaphoretic.  HENT:     Head: Normocephalic and atraumatic.     Mouth/Throat:     Comments: No trismus.  Teeth are generally in good repair.  Right upper gumline with a 1 and half centimeter abscess, present, tender to touch.  Mild associated tenderness of the right cheek with some slight swelling appreciated. Eyes:     Conjunctiva/sclera: Conjunctivae normal.     Pupils: Pupils are equal, round, and reactive to light.  Neck:     Trachea: Phonation normal.  Cardiovascular:     Rate and Rhythm: Normal rate.  Pulmonary:     Effort: Pulmonary effort is normal.  Chest:     Chest wall: No tenderness.  Abdominal:     General: There is no distension.     Palpations: Abdomen is soft.  Musculoskeletal:        General: Normal range of motion.     Cervical back: Normal range of motion and neck supple.  Skin:    General: Skin is warm and dry.  Neurological:     Mental Status: She is alert and oriented to person, place, and time.     Motor: No abnormal muscle tone.  Psychiatric:        Mood and Affect: Mood normal.        Behavior: Behavior normal.        Thought Content: Thought content normal.        Judgment: Judgment normal.    ED Results / Procedures / Treatments   Labs (all labs ordered are listed, but only abnormal results are displayed) Labs Reviewed - No data to display  EKG None  Radiology No results found.  Procedures Procedures   Medications Ordered in ED Medications  penicillin v potassium (VEETID) tablet 500 mg (500 mg Oral Given 06/27/21 0832)  oxyCODONE-acetaminophen (PERCOCET/ROXICET) 5-325 MG per tablet 1 tablet (1 tablet Oral Given 06/27/21 [id number])    ED Course  I have reviewed the triage vital signs and the nursing notes.  Pertinent labs & imaging results that were available during my care of the patient were reviewed by me and considered in my medical decision making (see chart for  details).    MDM Rules/Calculators/A&P                            Patient Vitals for the past 24 hrs:  BP Temp Temp src Pulse Resp SpO2  06/27/21 0743 (!) 159/108 98 F (36.7  C) Oral 72 19 96 %    9:29 AM Reevaluation with update and discussion. After initial assessment and treatment, an updated evaluation reveals she is more comfortable now.  Findings discussed and questions answered. Daleen Bo   Medical Decision Making:  This patient is presenting for evaluation of oral pain, which does require a range of treatment options, and is a complaint that involves a moderate risk of morbidity and mortality. The differential diagnoses include dental problem, oral infection. I decided to review old records, and in summary elderly female presenting with painful condition.  She has history of hypertension, obesity, sleep apnea.  I obtain additional historical information from family member at bedside.    Critical Interventions-clinical evaluation, medication treatment, observation and reassessment  After These Interventions, the Patient was reevaluated and was found stable for discharge.  She has a intraoral abscess likely dental in source, not requiring further intervention in the ED setting.  Prescription sent to her pharmacy, for antibiotic and narcotic pain relief.  She is advised to aggressively use warm compresses, or heating pad to improve healing, and given follow-up instructions for PCP and/or dentist, as needed  CRITICAL CARE-no Performed by: Daleen Bo  Nursing Notes Reviewed/ Care Coordinated Applicable Imaging Reviewed Interpretation of Laboratory Data incorporated into ED treatment  The patient appears reasonably screened and/or stabilized for discharge and I doubt any other medical condition or other [facility] requiring further screening, evaluation, or treatment in the ED at this time prior to discharge.  Plan: Home Medications-continue usual; Home Treatments-heat to  affected area; return here if the recommended treatment, does not improve the symptoms; Recommended follow up-PCP, as needed     Final Clinical Impression(s) / ED Diagnoses Final diagnoses:  Dental abscess    Rx / DC Orders ED Discharge Orders          Ordered    penicillin v potassium (VEETID) 500 MG tablet  4 times daily        06/27/21 0933    oxyCODONE-acetaminophen (PERCOCET) 5-325 MG tablet  Every 4 hours PRN        06/27/21 [id number]             Daleen Bo, MD 06/27/21 [phone]

## 2021-06-27 NOTE — Discharge Instructions (Addendum)
We are treating you for an intraoral abscess which is likely from a dental problem.  Prescriptions were sent to your pharmacy.  The antibiotic can help the infection heal quicker.  The narcotic, oxycodone, can make you sleepy so do not drive or drink alcohol with it.  To help the infection heal quicker, use a warm compress or heating pad on the area that is sore, 4-5 times a day for 30 minutes.  Check with your doctor if not improving after a couple of days.  You also may need to follow-up with a dentist for a checkup on your teeth.

## 2021-06-27 NOTE — ED Triage Notes (Signed)
Pt complains of right lower facial swelling and upper mouth pain. Swelling started this morning.

## 2021-07-07 ENCOUNTER — Ambulatory Visit
Admission: RE | Admit: 2021-07-07 | Discharge: 2021-07-07 | Disposition: A | Discharge status: Home / Self Care | Payer: 59 | Source: Ambulatory Visit | Attending: Obstetrics & Gynecology | Admitting: Obstetrics & Gynecology

## 2021-07-07 ENCOUNTER — Other Ambulatory Visit: Payer: Self-pay

## 2021-07-07 DIAGNOSIS — Z780 Asymptomatic menopausal state: Secondary | ICD-10-CM

## 2021-07-11 ENCOUNTER — Ambulatory Visit (INDEPENDENT_AMBULATORY_CARE_PROVIDER_SITE_OTHER): Payer: 59 | Admitting: Gastroenterology

## 2021-07-11 ENCOUNTER — Ambulatory Visit: Payer: 59 | Admitting: Internal Medicine

## 2021-07-11 ENCOUNTER — Encounter: Payer: Self-pay | Admitting: Gastroenterology

## 2021-07-11 VITALS — BP 130/84 | HR 59 | Ht 61.0 in | Wt 298.0 lb

## 2021-07-11 DIAGNOSIS — A048 Other specified bacterial intestinal infections: Secondary | ICD-10-CM

## 2021-07-11 NOTE — Progress Notes (Signed)
Review of pertinent gastrointestinal problems: 1.  Chronic hepatitis B diagnosed 2022, no signs of cirrhosis on imaging or blood work, followed by infectious disease who is ordering hepatoma screening imaging regularly. 2.  History of precancerous colon polyps.  Colonoscopy June 2022 found 2 subcentimeter adenomas.  Recall at 7 years recommended. 2.  Upper abdominal pains led to EGD June 2022 which showed mild gastritis.  Biopsies were somewhat suggestive of H. pylori.  Follow-up H. pylori serologies were positive and so she was treated with appropriate antibiotics (pylera type abx, 2 weeks).   HPI: This is a very pleasant 65 year old woman whom I last saw at the time of colonoscopy and upper endoscopy about 3 months ago.  See those results summarized above  Her weight is down 2 pounds since her last office visit here 7 months ago, still morbidly obese with a BMI well over 50  She speaks almost 0 English however her son is here and he is a very helpful translator and history giver.  She completed the 14 days of antibiotics for her H. pylori.  She is still bothered by intermittent right flank and left flank discomforts that seem to improve if she eats.  She has no nausea or vomiting.  No other abdominal pains.   ROS: complete GI ROS as described in HPI, all other review negative.  Constitutional:  No unintentional weight loss   Past Medical History:  Diagnosis Date   Arthritis    both knees   Gallstones 11/2020   Hepatitis B 01/31/2021   Hypertension     Past Surgical History:  Procedure Laterality Date   BIOPSY  03/30/2021   Procedure: BIOPSY;  Surgeon: Milus Banister, MD;  [facility];  Service: Endoscopy;;   COLONOSCOPY WITH PROPOFOL N/A 03/30/2021   Procedure: COLONOSCOPY WITH PROPOFOL;  Surgeon: Milus Banister, MD;  [facility];  Service: Endoscopy;  Laterality: N/A;   ESOPHAGOGASTRODUODENOSCOPY (EGD) WITH PROPOFOL N/A 03/30/2021   Procedure:  ESOPHAGOGASTRODUODENOSCOPY (EGD) WITH PROPOFOL;  Surgeon: Milus Banister, MD;  [facility];  Service: Endoscopy;  Laterality: N/A;   POLYPECTOMY  03/30/2021   Procedure: POLYPECTOMY;  Surgeon: Milus Banister, MD;  [facility];  Service: Endoscopy;;    Current Outpatient Medications  Medication Sig Dispense Refill   acetaminophen (TYLENOL) 325 MG tablet Take 650 mg by mouth every 6 (six) hours as needed for moderate pain.     lisinopril-hydrochlorothiazide (ZESTORETIC) 20-12.5 MG tablet Take 2 tablets by mouth daily. 180 tablet 3   Menthol-Methyl Salicylate (MUSCLE RUB) 10-15 % CREA Apply 1 application topically as needed for muscle pain.     Multiple Vitamins-Minerals (MULTIVITAMIN ADULT) CHEW Chew 2 each by mouth daily.     omeprazole (PRILOSEC) 20 MG capsule Take 1 capsule (20 mg total) by mouth daily. 90 capsule 1   omeprazole (PRILOSEC) 20 MG capsule Take 1 capsule (20 mg total) by mouth 2 (two) times daily before a meal. 20 capsule 0   oxyCODONE-acetaminophen (PERCOCET) 5-325 MG tablet Take 1 tablet by mouth every 4 (four) hours as needed for severe pain or moderate pain. 20 tablet 0   No current facility-administered medications for this visit.    Allergies as of 07/11/2021   (No Known Allergies)    Family History  Problem Relation Age of Onset   Colon cancer Neg Hx        unknown    Social History   Socioeconomic History   Marital status: Single  Spouse name: Not on file   Number of children: Not on file   Years of education: Not on file   Highest education level: Not on file  Occupational History   Not on file  Tobacco Use   Smoking status: Never   Smokeless tobacco: Never  Substance and Sexual Activity   Alcohol use: Never   Drug use: Never   Sexual activity: Not Currently  Other Topics Concern   Not on file  Social History Narrative   Not on file   Social Determinants of Health   Financial Resource Strain: Not on file  Food  Insecurity: Not on file  Transportation Needs: Not on file  Physical Activity: Not on file  Stress: Not on file  Social Connections: Not on file  Intimate Partner Violence: Not on file     Physical Exam: BP 130/84   Pulse (!) 59   Ht '5\' 1"'$  (1.549 m)   Wt 298 lb (135.2 kg)   BMI 56.31 kg/m  Constitutional: generally well-appearing Psychiatric: alert and oriented x3 Abdomen: soft, nontender, nondistended, no obvious ascites, no peritoneal signs, normal bowel sounds No peripheral edema noted in lower extremities  Assessment and plan: 66 y.o. female with history of H. pylori gastritis, right flank and left flank discomforts, precancerous colon polyps  First she and her son know that it is probably unlikely that her right flank pain and her left flank pain are related to a GI problem.  I would however like to see if her H. pylori has truly been eradicated with an H. pylori stool antigen test after she has been off proton pump inhibitor for 10 days.  I see no reason for any further blood tests or imaging studies prior to then  Second they understand that she needs repeat surveillance colonoscopy in about 7 years given her low risk adenomas found on examination 3 months ago.  Please see the "Patient Instructions" section for addition details about the plan.  Owens Loffler, MD [facility] 07/11/2021, 9:20 AM   Total time on date of encounter was 30 minutes (this included time spent preparing to see the patient reviewing records; obtaining and/or reviewing separately obtained history; performing a medically appropriate exam and/or evaluation; counseling and educating the patient and family if present; ordering medications, tests or procedures if applicable; and documenting clinical information in the health record).

## 2021-07-11 NOTE — Patient Instructions (Addendum)
If you are age 67 or older, your body mass index should be between 23-30. Your Body mass index is 56.31 kg/m. If this is out of the aforementioned range listed, please consider follow up with your Primary Care Provider. __________________________________________________________  The [facility] providers would like to encourage you to use [facility] to communicate with providers for non-urgent requests or questions.  Due to long hold times on the telephone, sending your provider a message by [facility] may be a faster and more efficient way to get a response.  Please allow 48 business hours for a response.  Please remember that this is for non-urgent requests.   Please DISCONTINUE omeprazole (Prilosec).  Dr Ardis Hughs has requested that you go to the basement level for lab work in 10 days on 07-21-2021. Press "B" on the elevator. The lab is located at the first door on the left as you exit the elevator.  Due to recent changes in healthcare laws, you may see the results of your imaging and laboratory studies on MyChart before your provider has had a chance to review them.  We understand that in some cases there may be results that are confusing or concerning to you. Not all laboratory results come back in the same time frame and the provider may be waiting for multiple results in order to interpret others.  Please give [place] 48 hours in order for your provider to thoroughly review all the results before contacting the office for clarification of your results.   Thank you for entrusting me with your care and choosing [facility].  Dr Ardis Hughs

## 2021-07-12 ENCOUNTER — Telehealth: Payer: Self-pay | Admitting: *Deleted

## 2021-07-12 ENCOUNTER — Encounter: Payer: Self-pay | Admitting: Internal Medicine

## 2021-07-12 ENCOUNTER — Ambulatory Visit (INDEPENDENT_AMBULATORY_CARE_PROVIDER_SITE_OTHER): Payer: 59 | Admitting: Internal Medicine

## 2021-07-12 ENCOUNTER — Other Ambulatory Visit: Payer: Self-pay

## 2021-07-12 VITALS — BP 133/70 | HR 67

## 2021-07-12 DIAGNOSIS — G4733 Obstructive sleep apnea (adult) (pediatric): Secondary | ICD-10-CM | POA: Diagnosis not present

## 2021-07-12 DIAGNOSIS — K8020 Calculus of gallbladder without cholecystitis without obstruction: Secondary | ICD-10-CM | POA: Diagnosis not present

## 2021-07-12 DIAGNOSIS — Z23 Encounter for immunization: Secondary | ICD-10-CM | POA: Diagnosis not present

## 2021-07-12 DIAGNOSIS — I10 Essential (primary) hypertension: Secondary | ICD-10-CM

## 2021-07-12 DIAGNOSIS — F5101 Primary insomnia: Secondary | ICD-10-CM | POA: Diagnosis not present

## 2021-07-12 DIAGNOSIS — Z6841 Body Mass Index (BMI) 40.0 and over, adult: Secondary | ICD-10-CM

## 2021-07-12 MED ORDER — VITAMIN D3 25 MCG (1000 UT) PO CAPS
1000.0000 [IU] | ORAL_CAPSULE | Freq: Every day | ORAL | 1 refills | Status: DC
Start: 2021-07-12 — End: 2022-02-19

## 2021-07-12 MED ORDER — TRAZODONE HCL 50 MG PO TABS: 25.0000 mg | ORAL_TABLET | Freq: Every evening | ORAL | 3 refills | Status: AC | PRN

## 2021-07-12 NOTE — Assessment & Plan Note (Signed)

## 2021-07-12 NOTE — Telephone Encounter (Signed)
Pt's son informed of bone density scan results and recommendations from Dr Harolyn Rutherford.

## 2021-07-12 NOTE — Assessment & Plan Note (Signed)
We will start trazodone

## 2021-07-12 NOTE — Assessment & Plan Note (Signed)

## 2021-07-12 NOTE — Assessment & Plan Note (Signed)
Suggest CPAP therapy

## 2021-07-12 NOTE — Assessment & Plan Note (Signed)
Gallstones are stable at the present time

## 2021-07-12 NOTE — Progress Notes (Signed)
Established Patient Office Visit  Subjective:  Patient ID: Dawn Mahoney, female    DOB: [birth year]  Age: 65 y.o. MRN: [id number]  CC: No chief complaint on file.   HPI  Dawn Mahoney presents for general check up, patient has a history of obesity she also has a problem with snoring at night.  I suggested that she should get a sleep study.  And try to lose weight.  She does not smoke does not drink.  Denies any chest pain.  Gallbladder pain has subsided and there is no recurrence.  Past Medical History:  Diagnosis Date   Arthritis    both knees   Gallstones 11/2020   Hepatitis B 01/31/2021   Hypertension     Past Surgical History:  Procedure Laterality Date   BIOPSY  03/30/2021   Procedure: BIOPSY;  Surgeon: Milus Banister, MD;  Location: [facility];  Service: Endoscopy;;   COLONOSCOPY WITH PROPOFOL N/A 03/30/2021   Procedure: COLONOSCOPY WITH PROPOFOL;  Surgeon: Milus Banister, MD;  Location: [facility];  Service: Endoscopy;  Laterality: N/A;   ESOPHAGOGASTRODUODENOSCOPY (EGD) WITH PROPOFOL N/A 03/30/2021   Procedure: ESOPHAGOGASTRODUODENOSCOPY (EGD) WITH PROPOFOL;  Surgeon: Milus Banister, MD;  Location: [facility];  Service: Endoscopy;  Laterality: N/A;   POLYPECTOMY  03/30/2021   Procedure: POLYPECTOMY;  Surgeon: Milus Banister, MD;  Location: [facility];  Service: Endoscopy;;    Family History  Problem Relation Age of Onset   Colon cancer Neg Hx        unknown    Social History   Socioeconomic History   Marital status: Single    Spouse name: Not on file   Number of children: Not on file   Years of education: Not on file   Highest education level: Not on file  Occupational History   Not on file  Tobacco Use   Smoking status: Never   Smokeless tobacco: Never  Substance and Sexual Activity   Alcohol use: Never   Drug use: Never   Sexual activity: Not Currently  Other Topics Concern   Not on file  Social History Narrative   Not on file   Social  Determinants of Health   Financial Resource Strain: Not on file  Food Insecurity: Not on file  Transportation Needs: Not on file  Physical Activity: Not on file  Stress: Not on file  Social Connections: Not on file  Intimate Partner Violence: Not on file     Current Outpatient Medications:    Cholecalciferol (VITAMIN D3) 25 MCG (1000 UT) CAPS, Take 1 capsule (1,000 Units total) by mouth daily., Disp: 60 capsule, Rfl: 1   traZODone (DESYREL) 50 MG tablet, Take 0.5-1 tablets (25-50 mg total) by mouth at bedtime as needed for sleep., Disp: 30 tablet, Rfl: 3   acetaminophen (TYLENOL) 325 MG tablet, Take 650 mg by mouth every 6 (six) hours as needed for moderate pain., Disp: , Rfl:    lisinopril-hydrochlorothiazide (ZESTORETIC) 20-12.5 MG tablet, Take 2 tablets by mouth daily., Disp: 180 tablet, Rfl: 3   Menthol-Methyl Salicylate (MUSCLE RUB) 10-15 % CREA, Apply 1 application topically as needed for muscle pain., Disp: , Rfl:    Multiple Vitamins-Minerals (MULTIVITAMIN ADULT) CHEW, Chew 2 each by mouth daily., Disp: , Rfl:    oxyCODONE-acetaminophen (PERCOCET) 5-325 MG tablet, Take 1 tablet by mouth every 4 (four) hours as needed for severe pain or moderate pain., Disp: 20 tablet, Rfl: 0   No Known Allergies  ROS Review of Systems  Constitutional: Negative.   HENT: Negative.    Eyes: Negative.   Respiratory: Negative.    Cardiovascular: Negative.   Gastrointestinal: Negative.   Endocrine: Negative.   Genitourinary: Negative.   Musculoskeletal: Negative.   Skin: Negative.   Allergic/Immunologic: Negative.   Neurological: Negative.   Hematological: Negative.   Psychiatric/Behavioral: Negative.    All other systems reviewed and are negative.    Objective:    Physical Exam Vitals reviewed.  Constitutional:      Appearance: Normal appearance.  HENT:     Mouth/Throat:     Mouth: Mucous membranes are moist.  Eyes:     Pupils: Pupils are equal, round, and reactive to light.   Neck:     Vascular: No carotid bruit.  Cardiovascular:     Rate and Rhythm: Normal rate and regular rhythm.     Pulses: Normal pulses.     Heart sounds: Normal heart sounds.  Pulmonary:     Effort: Pulmonary effort is normal.     Breath sounds: Normal breath sounds.  Abdominal:     General: Bowel sounds are normal.     Palpations: Abdomen is soft. There is no hepatomegaly, splenomegaly or mass.     Tenderness: There is no abdominal tenderness.     Hernia: No hernia is present.  Musculoskeletal:        General: No tenderness.     Cervical back: Neck supple.     Right lower leg: No edema.     Left lower leg: No edema.  Skin:    Findings: No rash.  Neurological:     Mental Status: She is alert and oriented to person, place, and time.     Motor: No weakness.  Psychiatric:        Mood and Affect: Mood and affect normal.        Behavior: Behavior normal.    There were no vitals taken for this visit. Wt Readings from Last 3 Encounters:  07/11/21 298 lb (135.2 kg)  04/06/21 (!) 300 lb 11.2 oz (136.4 kg)  03/30/21 (!) 300 lb 0.7 oz (136.1 kg)     Health Maintenance Due  Topic Date Due   TETANUS/TDAP  Never done   Zoster Vaccines- Shingrix (1 of 2) Never done   PNA vac Low Risk Adult (1 of 2 - PCV13) Never done   COVID-19 Vaccine (3 - Booster) 02/24/2021   INFLUENZA VACCINE  Never done    There are no preventive care reminders to display for this patient.  Lab Results  Component Value Date   TSH 4.43 11/30/2020   Lab Results  Component Value Date   WBC 7.0 11/30/2020   HGB 14.6 11/30/2020   HCT 43.3 11/30/2020   MCV 96.9 11/30/2020   PLT 299 11/30/2020   Lab Results  Component Value Date   NA 140 11/30/2020   K 4.8 11/30/2020   CO2 23 11/30/2020   GLUCOSE 82 11/30/2020   BUN 12 11/30/2020   CREATININE 0.75 11/30/2020   BILITOT 0.4 11/30/2020   AST 46 (H) 11/30/2020   ALT 40 (H) 11/30/2020   PROT 9.0 (H) 11/30/2020   CALCIUM 9.5 11/30/2020   Lab  Results  Component Value Date   CHOL 218 (H) 11/30/2020   Lab Results  Component Value Date   HDL 62 11/30/2020   Lab Results  Component Value Date   LDLCALC 138 (H) 11/30/2020   Lab Results  Component Value Date   TRIG 83 11/30/2020  Lab Results  Component Value Date   CHOLHDL 3.5 11/30/2020   No results found for: HGBA1C    Assessment & Plan:   Problem List Items Addressed This Visit       Cardiovascular and Mediastinum   Essential hypertension     Patient denies any chest pain or shortness of breath there is no history of palpitation or paroxysmal nocturnal dyspnea   patient was advised to follow low-salt low-cholesterol diet    ideally I want to keep systolic blood pressure below 130 mmHg, patient was asked to check blood pressure one times a week and give me a report on that.  Patient will be follow-up in 3 months  or earlier as needed, patient will call me back for any change in the cardiovascular symptoms Patient was advised to buy a book from local bookstore concerning blood pressure and read several chapters  every day.  This will be supplemented by some of the material we will give him from the office.  Patient should also utilize other resources like YouTube and Internet to learn more about the blood pressure and the diet.        Respiratory   OSA (obstructive sleep apnea)    Suggest CPAP therapy        Digestive   Gallstones    Gallstones are stable at the present time        Other   Class 3 severe obesity due to excess calories without serious comorbidity with body mass index (BMI) of 50.0 to 59.9 in adult [facility])    - I encouraged the patient to lose weight.  - I educated them on making healthy dietary choices including eating more fruits and vegetables and less fried foods. - I encouraged the patient to exercise more, and educated on the benefits of exercise including weight loss, diabetes prevention, and hypertension prevention.   Dietary  counseling with a registered dietician  Referral to a weight management support group (e.g. Weight Watchers, Overeaters Anonymous)  If your BMI is greater than 29 or you have gained more than 15 pounds you should work on weight loss.  Attend a healthy cooking class       Primary insomnia - Primary    We will start trazodone      Relevant Medications   traZODone (DESYREL) 50 MG tablet   Cholecalciferol (VITAMIN D3) 25 MCG (1000 UT) CAPS    Meds ordered this encounter  Medications   traZODone (DESYREL) 50 MG tablet    Sig: Take 0.5-1 tablets (25-50 mg total) by mouth at bedtime as needed for sleep.    Dispense:  30 tablet    Refill:  3   Cholecalciferol (VITAMIN D3) 25 MCG (1000 UT) CAPS    Sig: Take 1 capsule (1,000 Units total) by mouth daily.    Dispense:  60 capsule    Refill:  1    Follow-up: No follow-ups on file.    Cletis Athens, MD

## 2021-07-12 NOTE — Telephone Encounter (Signed)
-----   Message from Osborne Oman, MD sent at 07/10/2021 10:05 AM EDT ----- Patient has osteopenia. Needs to have adequate calcium and Vitamin D intake, also do weight bearing exercises. Needs to follow up with PCP for possible medical therapy.  Please call to inform patient of results and recommendations.

## 2021-07-13 NOTE — Addendum Note (Signed)
Addended by: Anson [place] R on: 07/13/2021 12:59 PM   Modules accepted: Orders

## 2021-07-14 NOTE — Addendum Note (Signed)
Addended by: Stevan Born on: 07/14/2021 03:08 PM   Modules accepted: Orders

## 2021-07-21 ENCOUNTER — Other Ambulatory Visit: Payer: 59

## 2021-07-21 DIAGNOSIS — A048 Other specified bacterial intestinal infections: Secondary | ICD-10-CM

## 2021-07-23 LAB — H. PYLORI ANTIGEN, STOOL: H pylori Ag, Stl: NEGATIVE

## 2021-09-06 ENCOUNTER — Other Ambulatory Visit: Payer: Self-pay

## 2021-09-06 ENCOUNTER — Ambulatory Visit (INDEPENDENT_AMBULATORY_CARE_PROVIDER_SITE_OTHER): Payer: 59 | Admitting: Infectious Disease

## 2021-09-06 ENCOUNTER — Encounter: Payer: Self-pay | Admitting: Infectious Disease

## 2021-09-06 VITALS — BP 130/75 | HR 60 | Resp 16 | Wt 306.6 lb

## 2021-09-06 DIAGNOSIS — R109 Unspecified abdominal pain: Secondary | ICD-10-CM

## 2021-09-06 DIAGNOSIS — B181 Chronic viral hepatitis B without delta-agent: Secondary | ICD-10-CM

## 2021-09-06 DIAGNOSIS — R1032 Left lower quadrant pain: Secondary | ICD-10-CM | POA: Diagnosis not present

## 2021-09-06 DIAGNOSIS — Z6841 Body Mass Index (BMI) 40.0 and over, adult: Secondary | ICD-10-CM

## 2021-09-06 HISTORY — DX: Unspecified abdominal pain: R10.9

## 2021-09-06 NOTE — Progress Notes (Signed)
Subjective:  Chief complaint: Ongoing bilateral pain in her lower quadrants that she has had for several years   Patient ID: Dawn Mahoney, female    DOB: 11-Feb-1956, 65 y.o.   MRN: [id number]  HPI  Dawn Mahoney is a 65 year old [place] lady with a past medical history significant for gallstones hypertension who is experiencing right upper quadrant pain and continues to experience this pain when labs were done to work this up including a comprehensive metabolic panel and hepatitis panel.  Her hepatitis B surface antigen came back positive.  Transaminases were only minimally elevated right upper quadrant ultrasound with normal echogenicity of the liver with no lesions and some gallstones but without evidence of cholecystitis.  We checked labs on her  Did not have hepatitis B delta agent her hepatitis B DNA level was 75 copies per IU per mL That hepatitis B E antigen was negative it is E antigen antibody was also negative  Hepatitis  antibody was positive indicating likely prior vaccination versus infection.  Hepatitis C antibody is negative HIV test is negative.  Is appear to be doing relatively well.  She does have complaints of bilateral lower quadrant abdominal pain which is at times sharp and pulsating.  Her son request further could have a more aggressive imaging done to work-up this symptom including a CT scan.    Past Medical History:  Diagnosis Date   Abdominal pain 09/06/2021   Arthritis    both knees   Gallstones 11/2020   Hepatitis B 01/31/2021   Hypertension     Past Surgical History:  Procedure Laterality Date   BIOPSY  03/30/2021   Procedure: BIOPSY;  Surgeon: Milus Banister, MD;  Location: [facility];  Service: Endoscopy;;   COLONOSCOPY WITH PROPOFOL N/A 03/30/2021   Procedure: COLONOSCOPY WITH PROPOFOL;  Surgeon: Milus Banister, MD;  Location: [facility];  Service: Endoscopy;  Laterality: N/A;   ESOPHAGOGASTRODUODENOSCOPY (EGD) WITH PROPOFOL N/A 03/30/2021    Procedure: ESOPHAGOGASTRODUODENOSCOPY (EGD) WITH PROPOFOL;  Surgeon: Milus Banister, MD;  Location: [facility];  Service: Endoscopy;  Laterality: N/A;   POLYPECTOMY  03/30/2021   Procedure: POLYPECTOMY;  Surgeon: Milus Banister, MD;  Location: [facility];  Service: Endoscopy;;    Family History  Problem Relation Age of Onset   Colon cancer Neg Hx        unknown      Social History   Socioeconomic History   Marital status: Single    Spouse name: Not on file   Number of children: Not on file   Years of education: Not on file   Highest education level: Not on file  Occupational History   Not on file  Tobacco Use   Smoking status: Never   Smokeless tobacco: Never  Substance and Sexual Activity   Alcohol use: Never   Drug use: Never   Sexual activity: Not Currently  Other Topics Concern   Not on file  Social History Narrative   Not on file   Social Determinants of Health   Financial Resource Strain: Not on file  Food Insecurity: Not on file  Transportation Needs: Not on file  Physical Activity: Not on file  Stress: Not on file  Social Connections: Not on file    No Known Allergies   Current Outpatient Medications:    acetaminophen (TYLENOL) 325 MG tablet, Take 650 mg by mouth every 6 (six) hours as needed for moderate pain., Disp: , Rfl:    Cholecalciferol (VITAMIN D3) 25 MCG (1000  UT) CAPS, Take 1 capsule (1,000 Units total) by mouth daily., Disp: 60 capsule, Rfl: 1   lisinopril-hydrochlorothiazide (ZESTORETIC) 20-12.5 MG tablet, Take 2 tablets by mouth daily., Disp: 180 tablet, Rfl: 3   Menthol-Methyl Salicylate (MUSCLE RUB) 10-15 % CREA, Apply 1 application topically as needed for muscle pain., Disp: , Rfl:    Multiple Vitamins-Minerals (MULTIVITAMIN ADULT) CHEW, Chew 2 each by mouth daily., Disp: , Rfl:    oxyCODONE-acetaminophen (PERCOCET) 5-325 MG tablet, Take 1 tablet by mouth every 4 (four) hours as needed for severe pain or moderate pain., Disp: 20  tablet, Rfl: 0   traZODone (DESYREL) 50 MG tablet, Take 0.5-1 tablets (25-50 mg total) by mouth at bedtime as needed for sleep., Disp: 30 tablet, Rfl: 3   Review of Systems  Constitutional:  Negative for activity change, appetite change, chills, diaphoresis, fatigue, fever and unexpected weight change.  HENT:  Negative for congestion, rhinorrhea, sinus pressure, sneezing, sore throat and trouble swallowing.   Eyes:  Negative for photophobia and visual disturbance.  Respiratory:  Negative for cough, chest tightness, shortness of breath, wheezing and stridor.   Cardiovascular:  Negative for chest pain, palpitations and leg swelling.  Gastrointestinal:  Negative for abdominal distention, abdominal pain, anal bleeding, blood in stool, constipation, diarrhea, nausea and vomiting.  Genitourinary:  Negative for difficulty urinating, dysuria, flank pain and hematuria.  Musculoskeletal:  Negative for arthralgias, back pain, gait problem, joint swelling and myalgias.  Skin:  Negative for color change, pallor, rash and wound.  Neurological:  Negative for dizziness, tremors, weakness and light-headedness.  Hematological:  Negative for adenopathy. Does not bruise/bleed easily.  Psychiatric/Behavioral:  Negative for agitation, behavioral problems, confusion, decreased concentration, dysphoric mood and sleep disturbance.       Objective:   Physical Exam Constitutional:      General: She is not in acute distress.    Appearance: Normal appearance. She is well-developed. She is obese. She is not ill-appearing or diaphoretic.  HENT:     Head: Normocephalic and atraumatic.     Right Ear: Hearing and external ear normal.     Left Ear: Hearing and external ear normal.     Nose: No nasal deformity or rhinorrhea.  Eyes:     General: No scleral icterus.    Conjunctiva/sclera: Conjunctivae normal.     Right eye: Right conjunctiva is not injected.     Left eye: Left conjunctiva is not injected.     Pupils:  Pupils are equal, round, and reactive to light.  Neck:     Vascular: No JVD.  Cardiovascular:     Rate and Rhythm: Normal rate and regular rhythm.     Heart sounds: S1 normal and S2 normal.  Pulmonary:     Effort: Pulmonary effort is normal. No respiratory distress.     Breath sounds: No wheezing.  Abdominal:     General: Bowel sounds are normal. There is no distension.     Palpations: Abdomen is soft. There is no mass.     Tenderness: There is no abdominal tenderness.     Hernia: No hernia is present.  Musculoskeletal:        General: Normal range of motion.     Right shoulder: Normal.     Left shoulder: Normal.     Cervical back: Normal range of motion and neck supple.     Right hip: Normal.     Left hip: Normal.     Right knee: Normal.     Left knee:  Normal.  Lymphadenopathy:     Head:     Right side of head: No submandibular, preauricular or posterior auricular adenopathy.     Left side of head: No submandibular, preauricular or posterior auricular adenopathy.     Cervical: No cervical adenopathy.     Right cervical: No superficial or deep cervical adenopathy.    Left cervical: No superficial or deep cervical adenopathy.  Skin:    General: Skin is warm and dry.     Coloration: Skin is not pale.     Findings: No abrasion, bruising, ecchymosis, erythema, lesion or rash.     Nails: There is no clubbing.  Neurological:     General: No focal deficit present.     Mental Status: She is alert and oriented to person, place, and time.     Sensory: No sensory deficit.     Coordination: Coordination normal.     Gait: Gait normal.  Psychiatric:        Attention and Perception: She is attentive.        Mood and Affect: Mood normal.        Speech: Speech normal.        Behavior: Behavior normal. Behavior is cooperative.        Thought Content: Thought content normal.        Judgment: Judgment normal.          Assessment & Plan:  Chronic hepatitis B without hepatic coma  without delta agent:  Check hep B DNA check liver function tests with kidney function and CMP  I was going to order repeat ultrasound to screen for parasellar carcinoma but the son and the patient wanted me to image her abdomen aggressively with a CT scan so we will do this and instead of ultrasound unless it is not covered  Abdominal pain: Not clear what is causing this we will check CMP today and I am ordering a CT of the abdomen pelvis with contrast.  Obesity: she would benefit from weight loss plan

## 2021-09-08 LAB — COMPLETE METABOLIC PANEL WITH GFR
AG Ratio: 0.8 (calc) — ABNORMAL LOW (ref 1.0–2.5)
ALT: 22 U/L (ref 6–29)
AST: 30 U/L (ref 10–35)
Albumin: 3.7 g/dL (ref 3.6–5.1)
Alkaline phosphatase (APISO): 67 U/L (ref 37–153)
BUN: 16 mg/dL (ref 7–25)
CO2: 29 mmol/L (ref 20–32)
Calcium: 9.3 mg/dL (ref 8.6–10.4)
Chloride: 101 mmol/L (ref 98–110)
Creat: 0.82 mg/dL (ref 0.50–1.05)
Globulin: 4.6 g/dL (calc) — ABNORMAL HIGH (ref 1.9–3.7)
Glucose, Bld: 176 mg/dL — ABNORMAL HIGH (ref 65–99)
Potassium: 3.7 mmol/L (ref 3.5–5.3)
Sodium: 140 mmol/L (ref 135–146)
Total Bilirubin: 0.4 mg/dL (ref 0.2–1.2)
Total Protein: 8.3 g/dL — ABNORMAL HIGH (ref 6.1–8.1)
eGFR: 79 mL/min/{1.73_m2} (ref >=60)

## 2021-09-08 LAB — HEPATITIS B DNA, ULTRAQUANTITATIVE, PCR
Hepatitis B DNA (Calc): 1.43 Log IU/mL — ABNORMAL HIGH
Hepatitis B DNA: 27 IU/mL — ABNORMAL HIGH

## 2021-09-19 ENCOUNTER — Ambulatory Visit
Admission: RE | Admit: 2021-09-19 | Discharge: 2021-09-19 | Disposition: A | Discharge status: Home / Self Care | Payer: 59 | Source: Ambulatory Visit | Attending: Infectious Disease | Admitting: Infectious Disease

## 2021-09-19 ENCOUNTER — Other Ambulatory Visit: Payer: Self-pay

## 2021-09-19 DIAGNOSIS — B181 Chronic viral hepatitis B without delta-agent: Secondary | ICD-10-CM

## 2021-09-19 DIAGNOSIS — R1032 Left lower quadrant pain: Secondary | ICD-10-CM

## 2021-09-19 MED ORDER — IOPAMIDOL (ISOVUE-300) INJECTION 61%
100.0000 mL | Freq: Once | INTRAVENOUS | Status: AC | PRN
  Administered 2021-09-19: 100 mL via INTRAVENOUS

## 2021-09-20 ENCOUNTER — Telehealth: Payer: Self-pay

## 2021-09-20 NOTE — Telephone Encounter (Signed)
-----   Message from Truman Hayward, MD sent at 09/20/2021  9:31 AM EST ----- Can we ask patient and son if they would like [place] to order ultrasound and/or refer to GI for her having gallstones in biliary tree? ----- Message ----- From: Interface, Rad Results In Sent: 09/19/2021  10:45 PM EST To: Truman Hayward, MD

## 2021-09-20 NOTE — Telephone Encounter (Signed)
Called patient with assistance of [employer], no answer. RN asked interpreter to leave a message stating Dr. Lucianne Lei Dam's office is trying to contact her.   Beryle Flock, RN

## 2021-09-26 NOTE — Telephone Encounter (Signed)
Attempted to reach patient with [facility] [employer] [place] [phone]. No answer. Interpreters left voicemail as requested by staff to contact Dr. Derek Mound office at [phone]

## 2021-09-27 ENCOUNTER — Other Ambulatory Visit: Payer: Self-pay | Admitting: Infectious Disease

## 2021-09-27 ENCOUNTER — Other Ambulatory Visit: Payer: 59

## 2021-09-27 DIAGNOSIS — K8050 Calculus of bile duct without cholangitis or cholecystitis without obstruction: Secondary | ICD-10-CM

## 2021-09-27 NOTE — Telephone Encounter (Signed)
Patient's son returning call, he is open to both options and would like to proceed with whatever Dr. Tommy Medal recommends, although he is worried about additional imaging not being covered by insurance. If it will be covered, he likes the idea of getting additional information via the ultrasound before seeing GI.   Beryle Flock, RN

## 2021-09-27 NOTE — Telephone Encounter (Signed)
Patient's son asked that we please leave a voicemail with any information as he is currently in the middle of exams, but should be available by phone tomorrow 12/1. Left voicemail stating that ultrasound has been ordered and to be expecting a call to get that scheduled. Advised that next steps can be discussed once results are back.   Beryle Flock, RN

## 2021-10-11 ENCOUNTER — Ambulatory Visit
Admission: RE | Admit: 2021-10-11 | Discharge: 2021-10-11 | Disposition: A | Discharge status: Home / Self Care | Payer: 59 | Source: Ambulatory Visit | Attending: Infectious Disease | Admitting: Infectious Disease

## 2021-10-11 DIAGNOSIS — K8050 Calculus of bile duct without cholangitis or cholecystitis without obstruction: Secondary | ICD-10-CM

## 2021-10-13 ENCOUNTER — Telehealth: Payer: Self-pay

## 2021-10-13 ENCOUNTER — Encounter: Payer: Self-pay | Admitting: Infectious Disease

## 2021-10-13 NOTE — Telephone Encounter (Signed)
Patient's son called wanting to know if provider has reviewed [place] results yet and what next steps are. He would like a MyChart message with next steps. Will route to provider.   Beryle Flock, RN

## 2021-10-16 ENCOUNTER — Other Ambulatory Visit: Payer: Self-pay | Admitting: Infectious Disease

## 2021-10-16 DIAGNOSIS — K819 Cholecystitis, unspecified: Secondary | ICD-10-CM

## 2021-10-28 IMAGING — MG MM DIGITAL SCREENING BILAT W/ TOMO AND CAD
8 of 15 series · 8 of 40 positions shown · non-contrast
Comparison: None.

ACR Breast Density Category a: The breast tissue is almost entirely
fatty.

CLINICAL DATA: Screening. Baseline

EXAM:
DIGITAL SCREENING BILATERAL MAMMOGRAM WITH TOMOSYNTHESIS AND CAD
TECHNIQUE: Bilateral screening digital craniocaudal and mediolateral oblique
mammograms were obtained. Bilateral screening digital breast
tomosynthesis was performed. The images were evaluated with
computer-aided detection.

[L CC synth-2D (1 of 2)]
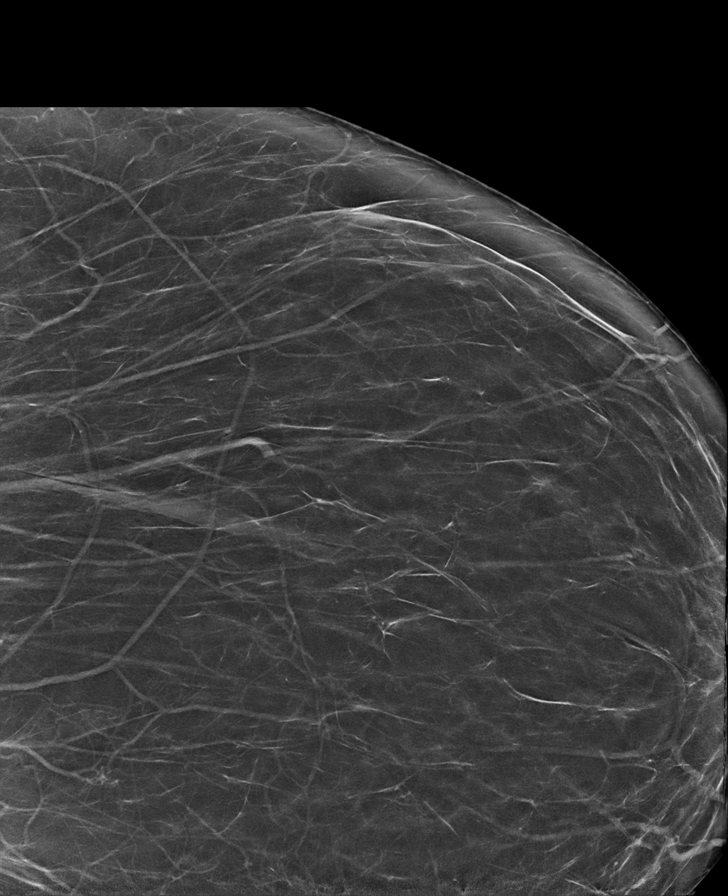

[R MLO synth-2D (1 of 2)]
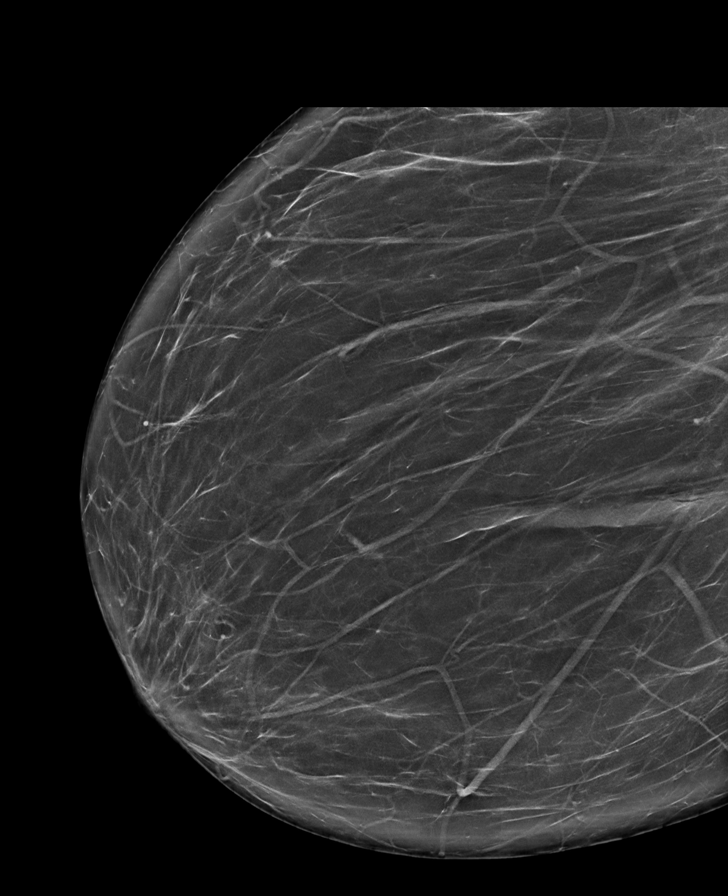

[R MLO synth-2D (2 of 2)]
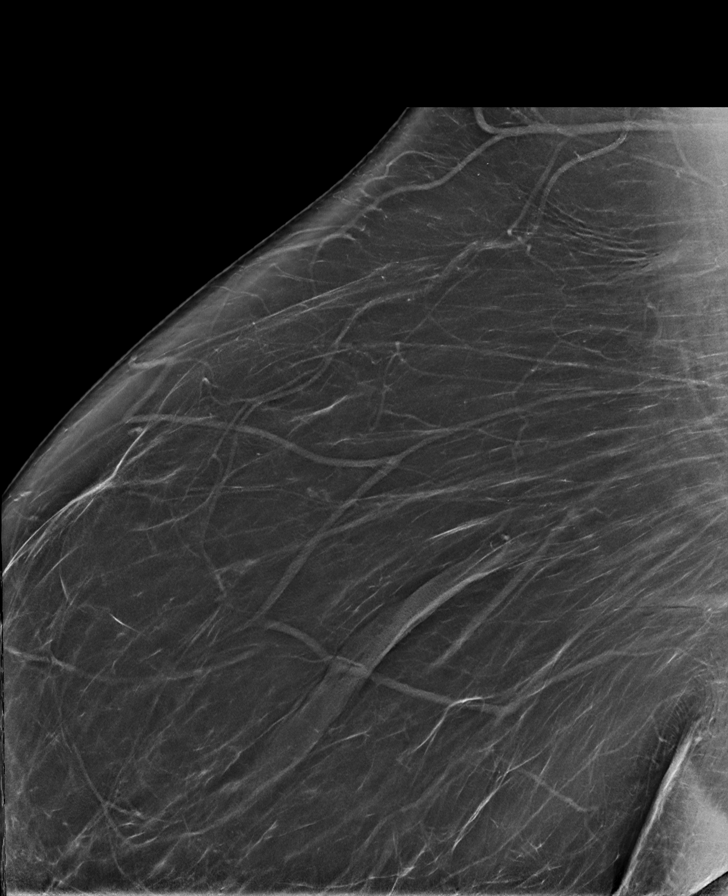

[L MLO synth-2D (1 of 2)]
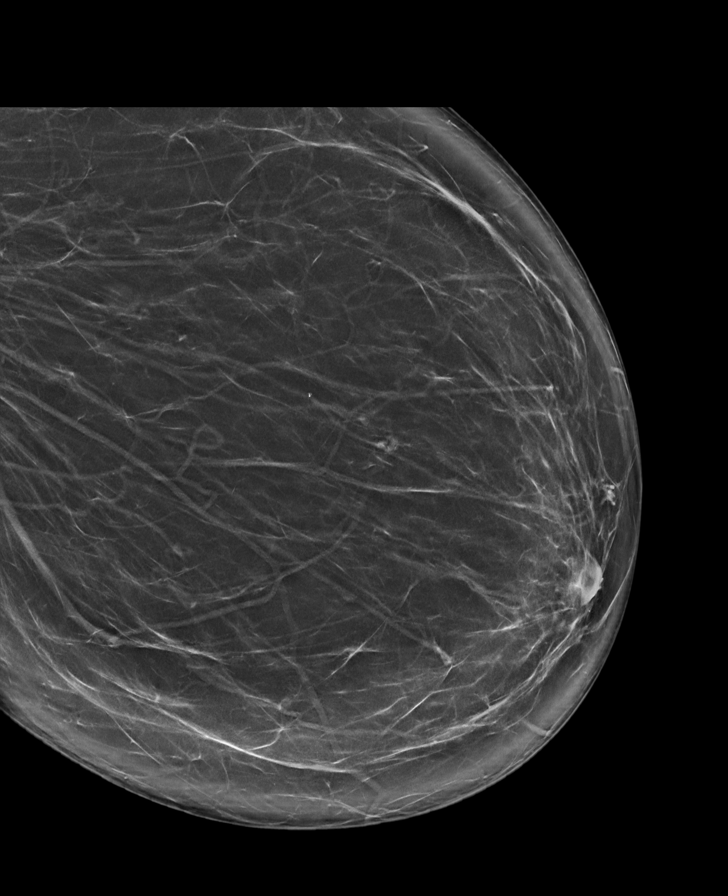

[L MLO synth-2D (2 of 2)]
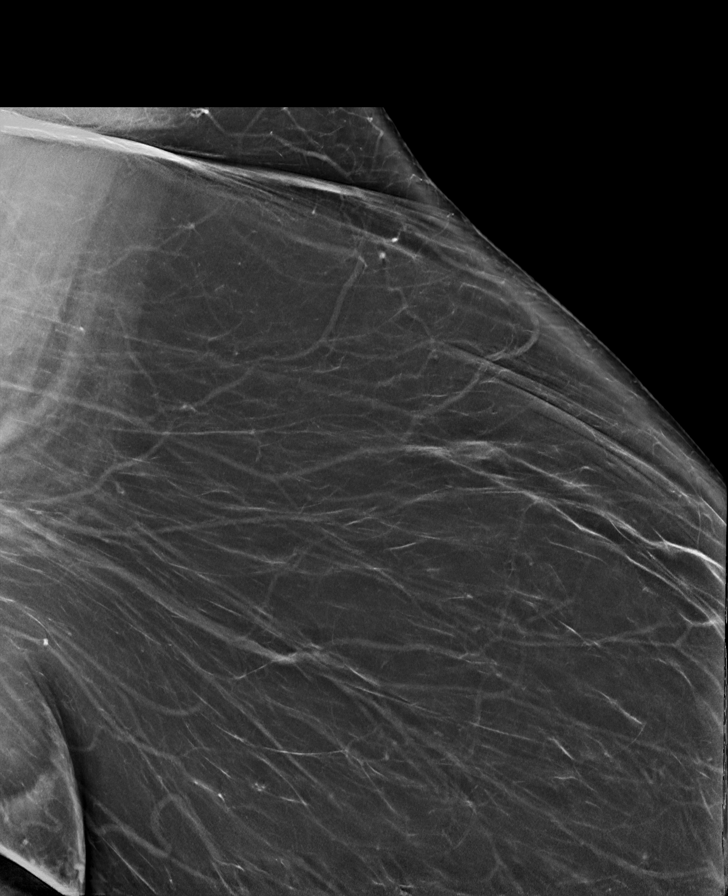

[L CC synth-2D (2 of 2)]
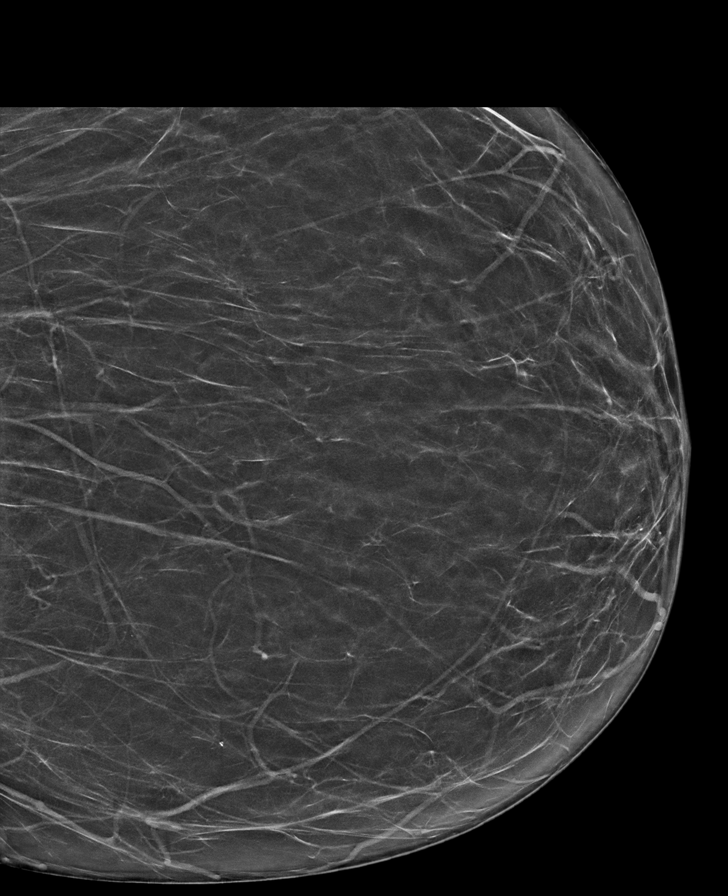

[R CC synth-2D]
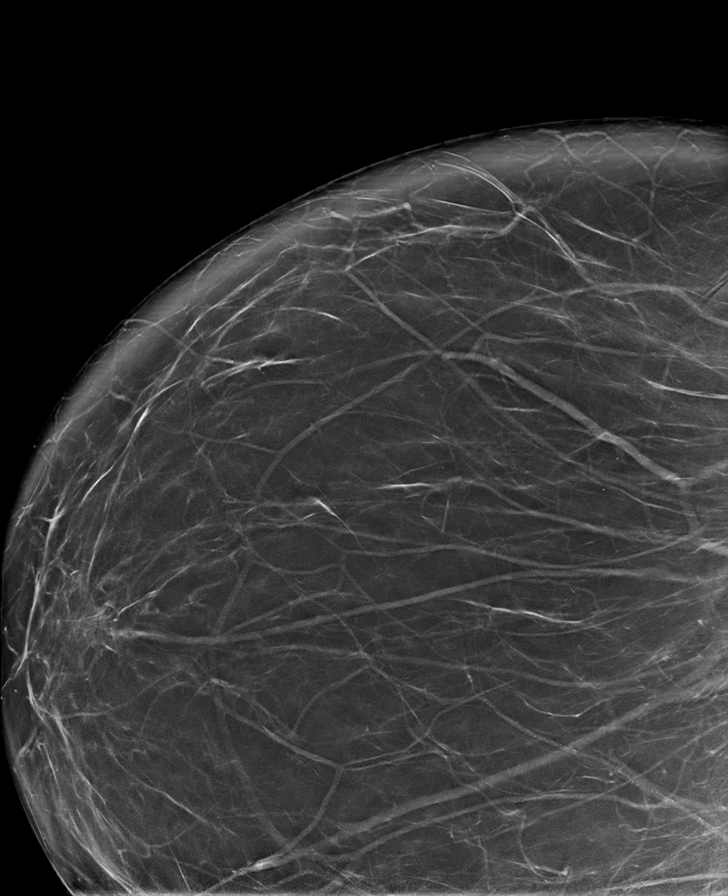

[L CC tomo · tomo slice 62/90.0]
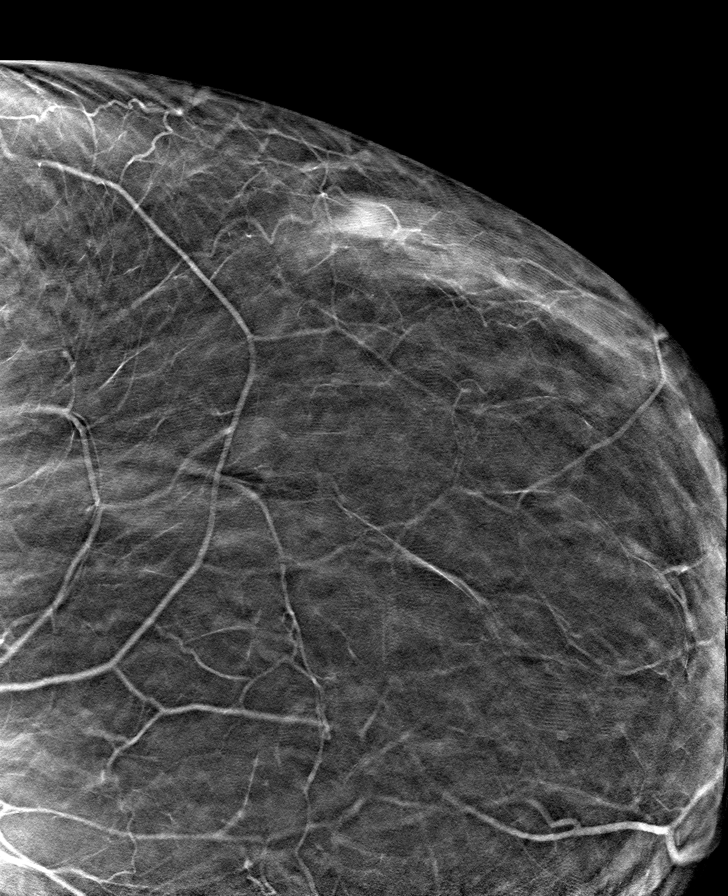

[8 of 40 positions shown; findings below may reference images not displayed]

FINDINGS: There are no findings suspicious for malignancy. The images were
evaluated with computer-aided detection.
IMPRESSION: No mammographic evidence of malignancy. A result letter of this
screening mammogram will be mailed directly to the patient.

RECOMMENDATION:
Screening mammogram in one year. (Code:H9-P-GU5)

BI-RADS CATEGORY  1: Negative.

## 2021-11-10 ENCOUNTER — Ambulatory Visit (INDEPENDENT_AMBULATORY_CARE_PROVIDER_SITE_OTHER): Payer: 59

## 2021-11-10 DIAGNOSIS — Z0000 Encounter for general adult medical examination without abnormal findings: Secondary | ICD-10-CM

## 2021-11-10 NOTE — Progress Notes (Signed)
Subjective:   Dawn Mahoney is a 66 y.o. female who presents for Medicare Annual (Subsequent) preventive examination. I discussed the limitations of evaluation and management by telemedicine and the availability of in person appointments. The patient expressed understanding and agreed to proceed.   Visit performed by audio   Patient location: Home  Provider location: Home  Review of Systems    N/A       Objective:    There were no vitals filed for this visit. There is no height or weight on file to calculate BMI.  Advanced Directives 11/10/2021 03/30/2021  Does Patient Have a Medical Advance Directive? No No  Would patient like information on creating a medical advance directive? - No - Patient declined    Current Medications (verified) Outpatient Encounter Medications as of 11/10/2021  Medication Sig   acetaminophen (TYLENOL) 325 MG tablet Take 650 mg by mouth every 6 (six) hours as needed for moderate pain.   Cholecalciferol (VITAMIN D3) 25 MCG (1000 UT) CAPS Take 1 capsule (1,000 Units total) by mouth daily.   lisinopril-hydrochlorothiazide (ZESTORETIC) 20-12.5 MG tablet Take 2 tablets by mouth daily.   Menthol-Methyl Salicylate (MUSCLE RUB) 10-15 % CREA Apply 1 application topically as needed for muscle pain.   Multiple Vitamins-Minerals (MULTIVITAMIN ADULT) CHEW Chew 2 each by mouth daily.   traZODone (DESYREL) 50 MG tablet Take 0.5-1 tablets (25-50 mg total) by mouth at bedtime as needed for sleep.   [DISCONTINUED] oxyCODONE-acetaminophen (PERCOCET) 5-325 MG tablet Take 1 tablet by mouth every 4 (four) hours as needed for severe pain or moderate pain. (Patient not taking: Reported on 11/10/2021)   No facility-administered encounter medications on file as of 11/10/2021.    Allergies (verified) Patient has no known allergies.   History: Past Medical History:  Diagnosis Date   Abdominal pain 09/06/2021   Arthritis    both knees   Gallstones 11/2020   Hepatitis B  01/31/2021   Hypertension    Past Surgical History:  Procedure Laterality Date   BIOPSY  03/30/2021   Procedure: BIOPSY;  Surgeon: Milus Banister, MD;  Location: [facility];  Service: Endoscopy;;   COLONOSCOPY WITH PROPOFOL N/A 03/30/2021   Procedure: COLONOSCOPY WITH PROPOFOL;  Surgeon: Milus Banister, MD;  Location: [facility];  Service: Endoscopy;  Laterality: N/A;   ESOPHAGOGASTRODUODENOSCOPY (EGD) WITH PROPOFOL N/A 03/30/2021   Procedure: ESOPHAGOGASTRODUODENOSCOPY (EGD) WITH PROPOFOL;  Surgeon: Milus Banister, MD;  [facility];  Service: Endoscopy;  Laterality: N/A;   POLYPECTOMY  03/30/2021   Procedure: POLYPECTOMY;  Surgeon: Milus Banister, MD;  [facility]: [facility];  Service: Endoscopy;;   Family History  Problem Relation Age of Onset   Colon cancer Neg Hx        unknown   Social History   Socioeconomic History   Marital status: Single    Spouse name: Not on file   Number of children: 8   Years of education: Middle school   Highest education level: 8th grade  Occupational History   Not on file  Tobacco Use   Smoking status: Never   Smokeless tobacco: Never  Vaping Use   Vaping Use: Never used  Substance and Sexual Activity   Alcohol use: Never   Drug use: Never   Sexual activity: Not Currently  Other Topics Concern   Not on file  Social History Narrative   Not on file   Social Determinants of Health   Financial Resource Strain: Low Risk    Difficulty of  Paying Living Expenses: Not very hard  Food Insecurity: Food Insecurity Present   Worried About Running Out of Food in the Last Year: Sometimes true   Ran Out of Food in the Last Year: Never true  Transportation Needs: No Transportation Needs   Lack of Transportation (Medical): No   Lack of Transportation (Non-Medical): No  Physical Activity: Insufficiently Active   Days of Exercise per Week: 2 days   Minutes of Exercise per Session: 30 min  Stress: No Stress Concern Present   Feeling of  Stress : Not at all  Social Connections: Unknown   Frequency of Communication with Friends and Family: More than three times a week   Frequency of Social Gatherings with Friends and Family: Not on file   Attends Religious Services: Not on Electrical engineer or Organizations: Not on file   Attends Archivist Meetings: Not on file   Marital Status: Not on file    Tobacco Counseling Counseling given: Not Answered   Clinical Intake:  Pre-visit preparation completed: Yes  Pain : No/denies pain     Diabetes: No  How often do you need to have someone help you when you read instructions, pamphlets, or other written materials from your doctor or pharmacy?: 5 - Always (Pt son's handles this) What is the last grade level you completed in school?: [place]  Diabetic?No  Interpreter Needed?: Yes Interpreter Name: Sunday (patient son)  Information entered by :: Anson [place] CMA   Activities of Daily Living In your present state of health, do you have any difficulty performing the following activities: 11/10/2021  Hearing? N  Vision? N  Difficulty concentrating or making decisions? N  Walking or climbing stairs? Y  Dressing or bathing? N  Doing errands, shopping? N  Preparing Food and eating ? N  Using the Toilet? N  Managing your Medications? N  Managing your Finances? N  Housekeeping or managing your Housekeeping? N    Patient Care Team: Cletis Athens, MD as PCP - General (Internal Medicine)  Indicate any recent Medical Services you may have received from other than [facility] providers in the past year (date may be approximate).     Assessment:   This is a routine wellness examination for Dawn Mahoney.  Hearing/Vision screen No results found.  Dietary issues and exercise activities discussed:     Goals Addressed   None    Depression Screen PHQ 2/9 Scores 11/10/2021  PHQ - 2 Score 0    Fall Risk Fall Risk  11/10/2021  Falls in the past  year? 0  Number falls in past yr: 0  Injury with Fall? 0  Risk for fall due to : No Fall Risks  Follow up Falls evaluation completed    [place]:  Any stairs in or around the home? No  If so, are there any without handrails? No  Home free of loose throw rugs in walkways, pet beds, electrical cords, etc? Yes  Adequate lighting in your home to reduce risk of falls? Yes   ASSISTIVE DEVICES UTILIZED TO PREVENT FALLS:  Life alert? No  Use of a cane, walker or w/c? No  Grab bars in the bathroom? Yes  Shower chair or bench in shower? No  Elevated toilet seat or a handicapped toilet? Yes   TIMED UP AND GO:  Was the test performed? No .  Length of time to ambulate 10 feet: 0 sec.     Cognitive  Function:        Immunizations Immunization History  Administered Date(s) Administered   Fluad Quad(high Dose 65+) 07/12/2021   Unspecified SARS-COV-2 Vaccination 08/15/2020, 09/26/2020    TDAP status: Up to date  Flu Vaccine status: Up to date  Pneumococcal vaccine status: Due, Education has been provided regarding the importance of this vaccine. Advised may receive this vaccine at local pharmacy or Health Dept. Aware to provide a copy of the vaccination record if obtained from local pharmacy or Health Dept. Verbalized acceptance and understanding.  Covid-19 vaccine status: Information provided on how to obtain vaccines.   Qualifies for Shingles Vaccine? Yes   Zostavax completed Yes   Shingrix Completed?: Yes  Screening Tests Health Maintenance  Topic Date Due   Pneumonia Vaccine 43+ Years old (1 - PCV) Never done   TETANUS/TDAP  Never done   Zoster Vaccines- Shingrix (1 of 2) Never done   COVID-19 Vaccine (3 - Booster) 11/21/2020   MAMMOGRAM  02/10/2023   COLONOSCOPY (Pts 45-58yrs Insurance coverage will need to be confirmed)  03/31/2031   INFLUENZA VACCINE  Completed   DEXA SCAN  Completed   Hepatitis C Screening  Completed   HPV  VACCINES  Aged Out    Health Maintenance  Health Maintenance Due  Topic Date Due   Pneumonia Vaccine 11+ Years old (1 - PCV) Never done   TETANUS/TDAP  Never done   Zoster Vaccines- Shingrix (1 of 2) Never done   COVID-19 Vaccine (3 - Booster) 11/21/2020    Colorectal cancer screening: Type of screening: Colonoscopy. Completed 03/2021. Repeat every 10 years  Mammogram status: Completed 01/2021. Repeat every year   Lung Cancer Screening: (Low Dose CT Chest recommended if Age 37-80 years, 30 pack-year currently smoking OR have quit w/in 15years.) does not qualify.   Lung Cancer Screening Referral: No  Additional Screening:  Hepatitis C Screening: does qualify; Completed Yes  Vision Screening: Recommended annual ophthalmology exams for early detection of glaucoma and other disorders of the eye. Is the patient up to date with their annual eye exam?  Yes  Who is the provider or what is the name of the office in which the patient attends annual eye exams? N/A If pt is not established with a provider, would they like to be referred to a provider to establish care? Yes .   Dental Screening: Recommended annual dental exams for proper oral hygiene  Community Resource Referral / Chronic Care Management: CRR required this visit?  No   CCM required this visit?  No      Plan:     I have personally reviewed and noted the following in the patients chart:   Medical and social history Use of alcohol, tobacco or illicit drugs  Current medications and supplements including opioid prescriptions.  Functional ability and status Nutritional status Physical activity Advanced directives List of other physicians Hospitalizations, surgeries, and ER visits in previous 12 months Vitals Screenings to include cognitive, depression, and falls Referrals and appointments  In addition, I have reviewed and discussed with patient certain preventive protocols, quality metrics, and best practice  recommendations. A written personalized care plan for preventive services as well as general preventive health recommendations were provided to patient.    Ms. Littrell , Thank you for taking time to come for your Medicare Wellness Visit. I appreciate your ongoing commitment to your health goals. Please review the following plan we discussed and let me know if I can assist you in the future.  These are the goals we discussed:  Goals   None     This is a list of the screening recommended for you and due dates:  Health Maintenance  Topic Date Due   Pneumonia Vaccine (1 - PCV) Never done   Zoster (Shingles) Vaccine (1 of 2) Never done   COVID-19 Vaccine (3 - Booster) 11/21/2020   Mammogram  02/10/2023   Tetanus Vaccine  09/28/2030   Colon Cancer Screening  03/31/2031   Flu Shot  Completed   DEXA scan (bone density measurement)  Completed   Hepatitis C Screening: USPSTF Recommendation to screen - Ages 18-79 yo.  Completed   HPV Vaccine  Aged [address], [place]   11/10/2021   Nurse Notes: Patient's son was informed of patient's care gaps. No referrals needed at this time.

## 2021-11-24 NOTE — Progress Notes (Signed)
I have reviewed this visit and agree with the documentation.   

## 2021-12-27 ENCOUNTER — Ambulatory Visit: Payer: 59 | Admitting: Gastroenterology

## 2021-12-27 ENCOUNTER — Encounter: Payer: Self-pay | Admitting: Gastroenterology

## 2021-12-27 VITALS — BP 144/82 | HR 64 | Wt 310.5 lb

## 2021-12-27 DIAGNOSIS — K8020 Calculus of gallbladder without cholecystitis without obstruction: Secondary | ICD-10-CM

## 2021-12-27 DIAGNOSIS — R1032 Left lower quadrant pain: Secondary | ICD-10-CM

## 2021-12-27 NOTE — Patient Instructions (Signed)
If you are age 68 or older, your body mass index should be between 23-30. Your Body mass index is 58.67 kg/m?Marland Kitchen If this is out of the aforementioned range listed, please consider follow up with your Primary Care Provider. ?_______________________________________________________ ? ?The [facility] providers would like to encourage you to use [facility] to communicate with providers for non-urgent requests or questions.  Due to long hold times on the telephone, sending your provider a message by [facility] may be a faster and more efficient way to get a response.  Please allow 48 business hours for a response.  Please remember that this is for non-urgent requests.  ?_______________________________________________________ ? ?You have been scheduled for an appointment with _________ at [facility]. Your appointment is on _________ at _______. Please arrive at ________ for registration. Make certain to bring a list of current medications, including any over the counter medications or vitamins. Also bring your co-pay if you have one as well as your insurance cards. [facility] is located at [address]. Should you need to reschedule your appointment, please contact them at [phone]. ? ?Thank you for entrusting me with your care and choosing [facility]. ? ?Dr Ardis Hughs ? ?

## 2021-12-27 NOTE — Progress Notes (Signed)
Review of pertinent gastrointestinal problems: ?1.  Chronic hepatitis B diagnosed 2022, no signs of cirrhosis on imaging or blood work, followed by infectious disease who is ordering hepatoma screening imaging regularly. ?2.  History of precancerous colon polyps.  Colonoscopy June 2022 found 2 subcentimeter adenomas.  Recall at 7 years recommended. ?2.  Upper abdominal pains led to EGD June 2022 which showed mild gastritis.  Biopsies were somewhat suggestive of H. pylori.  Follow-up H. pylori serologies were positive and so she was treated with appropriate antibiotics (pylera type abx, 2 weeks).  06/2021 H. pylori stool antigen was negative ? ? ?HPI: ?This is a very pleasant [place] speaking African woman who is here with her son today who speaks great English and serves as a very good Optometrist. ? ? ?She had a CT scan abdomen pelvis with IV and oral contrast November 2022, ordered by her infectious disease doctor, the indication was "abdominal pain and fevers".  This showed gallstones in her gallbladder.  This also showed "findings suspicious for choledocholithiasis without biliary ductal dilation.  Follow-up abdominal ultrasound December 2022 showed multiple shadowing stones.  The bile duct was normal at 2.7 mm.  She had blood work November 2022 and her liver tests were all normal. ? ?Her BMI is almost 59 ? ?Her weight is up 12 pounds since her last office visit here 6 months ago ? ?She is continue to have intermittent abdominal pains.  They often start in her upper abdomen and sometimes can radiate to her lower abdomen.  They can last for 8 to 10 minutes.  No associated nausea or vomiting.  No fevers or chills. ? ? ?ROS: complete GI ROS as described in HPI, all other review negative. ? ?Constitutional:  No unintentional weight loss ? ? ?Past Medical History:  ?Diagnosis Date  ? Abdominal pain 09/06/2021  ? Arthritis   ? both knees  ? Gallstones 11/2020  ? Hepatitis B 01/31/2021  ? Hypertension   ? ? ?Past Surgical  History:  ?Procedure Laterality Date  ? BIOPSY  03/30/2021  ? Procedure: BIOPSY;  Surgeon: Milus Banister, MD;  Location: [facility];  Service: Endoscopy;;  ? COLONOSCOPY WITH PROPOFOL N/A 03/30/2021  ? Procedure: COLONOSCOPY WITH PROPOFOL;  Surgeon: Milus Banister, MD;  Location: [facility];  Service: Endoscopy;  Laterality: N/A;  ? ESOPHAGOGASTRODUODENOSCOPY (EGD) WITH PROPOFOL N/A 03/30/2021  ? Procedure: ESOPHAGOGASTRODUODENOSCOPY (EGD) WITH PROPOFOL;  Surgeon: Milus Banister, MD;  Location: [facility];  Service: Endoscopy;  Laterality: N/A;  ? POLYPECTOMY  03/30/2021  ? Procedure: POLYPECTOMY;  Surgeon: Milus Banister, MD;  Location: [facility];  Service: Endoscopy;;  ? ? ?Current Outpatient Medications  ?Medication Instructions  ? acetaminophen (TYLENOL) 650 mg, Oral, Every 6 hours PRN  ? lisinopril-hydrochlorothiazide (ZESTORETIC) 20-12.5 MG tablet 2 tablets, Oral, Daily  ? Menthol-Methyl Salicylate (MUSCLE RUB) 08-14 % CREA 1 application, Topical, As needed  ? Multiple Vitamins-Minerals (MULTIVITAMIN ADULT) CHEW 2 each, Oral, Daily  ? traZODone (DESYREL) 25-50 mg, Oral, At bedtime PRN  ? Vitamin D3 1,000 Units, Oral, Daily  ? ? ?Allergies as of 12/27/2021  ? (No Known Allergies)  ? ? ?Family History  ?Problem Relation Age of Onset  ? Colon cancer Neg Hx   ?     unknown  ? ? ?Social History  ? ?Socioeconomic History  ? Marital status: Single  ?  Spouse name: Not on file  ? Number of children: 8  ? Years of education: Middle school  ? Highest  education level: 8th grade  ?Occupational History  ? Not on file  ?Tobacco Use  ? Smoking status: Never  ? Smokeless tobacco: Never  ?Vaping Use  ? Vaping Use: Never used  ?Substance and Sexual Activity  ? Alcohol use: Never  ? Drug use: Never  ? Sexual activity: Not Currently  ?Other Topics Concern  ? Not on file  ?Social History Narrative  ? Not on file  ? ?Social Determinants of Health  ? ?Financial Resource Strain: Low Risk   ? Difficulty of Paying Living  Expenses: Not very hard  ?Food Insecurity: Food Insecurity Present  ? Worried About Charity fundraiser in the Last Year: Sometimes true  ? Ran Out of Food in the Last Year: Never true  ?Transportation Needs: No Transportation Needs  ? Lack of Transportation (Medical): No  ? Lack of Transportation (Non-Medical): No  ?Physical Activity: Insufficiently Active  ? Days of Exercise per Week: 2 days  ? Minutes of Exercise per Session: 30 min  ?Stress: No Stress Concern Present  ? Feeling of Stress : Not at all  ?Social Connections: Unknown  ? Frequency of Communication with Friends and Family: More than three times a week  ? Frequency of Social Gatherings with Friends and Family: Not on file  ? Attends Religious Services: Not on file  ? Active Member of Clubs or Organizations: Not on file  ? Attends Archivist Meetings: Not on file  ? Marital Status: Not on file  ?Intimate Partner Violence: Not At Risk  ? Fear of Current or Ex-Partner: No  ? Emotionally Abused: No  ? Physically Abused: No  ? Sexually Abused: No  ? ? ? ?Physical Exam: ?BP (!) 144/82 (BP Location: Left Arm, Patient Position: Sitting, Cuff Size: Normal) Comment (BP Location): left forearm  Pulse 64   Wt (!) 310 lb 8 oz (140.8 kg)   SpO2 94%   BMI 58.67 kg/m?  ? ?Constitutional: generally well-appearing ?Psychiatric: alert and oriented x3 ?Abdomen: soft, nontender, nondistended, no obvious ascites, no peritoneal signs, normal bowel sounds ?No peripheral edema noted in lower extremities ? ?Assessment and plan: ?66 y.o. female with intermittent abdominal pains which might be related to gallstones ? ?She has multiple stones in her gallbladder and her pains could certainly be from those gallstones.  She is having these pains 2-3 times a week they last for up to 8 to 10 minutes and are colicky in nature.  They can be in the upper abdomen and sometimes in the lower abdomen.  The lower abdominal pains are a bit unlike biliary pains but certainly the  upper abdominal pains are more consistent.  I recommend surgical evaluation to consider elective cholecystectomy and we will arrange that referral. ? ?Please see the "Patient Instructions" section for addition details about the plan. ? ?Owens Loffler, MD ?[facility] ?12/27/2021, 2:04 PM ? ? ?Total time on date of encounter was 25 minutes (this included time spent preparing to see the patient reviewing records; obtaining and/or reviewing separately obtained history; performing a medically appropriate exam and/or evaluation; counseling and educating the patient and family if present; ordering medications, tests or procedures if applicable; and documenting clinical information in the health record). ? ?

## 2022-01-08 ENCOUNTER — Ambulatory Visit: Payer: Self-pay | Admitting: Internal Medicine

## 2022-01-09 ENCOUNTER — Other Ambulatory Visit: Payer: Self-pay | Admitting: Surgery

## 2022-02-20 ENCOUNTER — Encounter (HOSPITAL_COMMUNITY): Payer: Self-pay | Admitting: Surgery

## 2022-02-20 ENCOUNTER — Other Ambulatory Visit: Payer: Self-pay

## 2022-02-20 NOTE — Progress Notes (Signed)
I called [place] interpreter and requested an Erskin Burnet, they did not have one available. Dawn Mahoney's son Dawn Mahoney called me and told me that his mother wants son to assist with the call, I explained that patient has to tell me that is what she wants and she did. ? ?Dawn Mahoney reports that Dawn Mahoney does not complain of shortness of breath or chest pain.  Patient denies having any s/s of Covid in her household.  Patient denies any known exposure to Covid.  ? ?Dawn Mahoney' s PCP is Dawn Mahoney at [facility]. ? ?I instructed  Dawn Mahoney  to shower with antibacteria soap.  DO not shave. No nail polish, artificial or acrylic nails. Wear clean clothes, brush your teeth. ?Glasses, contact lens,dentures or partials may not be worn in the OR. If you need to wear them, please bring a case for glasses, do not wear contacts or bring a case, the hospital does not have contact cases, dentures or partials will have to be removed , make sure they are clean, we will provide a denture cup to put them in. You will need some one to drive you home and a responsible person over the age of 58 to stay with you for the first 24 hours after surgery.  ?

## 2022-02-20 NOTE — H&P (Signed)
?REFERRING PHYSICIAN: Gastroenterology, Cory Munch* ? ?PROVIDER: Beverlee Nims, MD ? ?MRN: [id number] ?DOB: [birth year] ? ?Subjective  ? ?Chief Complaint: New Consultation (Gallstones ) ? ? ?History of Present Illness: ?Dawn Mahoney is a 66 y.o. female who is seen  as an office consultation for evaluation of New Consultation (Gallstones ) ?.  ? ?This is a pleasant African female who is accompanied by her son as an interpreter who is referred from the gastroenterologist for symptomatic gallstones. She has had an extensive work-up of her right upper quadrant abdominal pain. This has included CT scans, ultrasound, and colonoscopy. Her ultrasound shows multiple gallstones without evidence of cholecystitis. She does have a history of chronic hepatitis B. She has weekly right upper quadrant abdominal pain. She describes a sharp and sometimes last several hours. She has had no nausea or vomiting with this. She has a difficult time relating it to the food she eats. She denies jaundice. ? ?Review of Systems: ?A complete review of systems was obtained from the patient. I have reviewed this information and discussed as appropriate with the patient. See HPI as well for other ROS. ? ?ROS  ? ?Medical History: ?Past Medical History:  ?Diagnosis Date  ? Arthritis  ? GERD (gastroesophageal reflux disease)  ? Hypertension  ? Sleep apnea  ? ?Patient Active Problem List  ?Diagnosis  ? Abdominal pain  ? Class 3 severe obesity due to excess calories without serious comorbidity with body mass index (BMI) of 50.0 to 59.9 in adult (CMS-HCC)  ? Encounter for screening for COVID-19  ? Essential hypertension  ? Gallstones  ? Hepatitis B  ? OSA (obstructive sleep apnea)  ? Primary insomnia  ? ?Past Surgical History:  ?Procedure Laterality Date  ? COLONOSCOPY 2022  ? ? ?Allergies  ?Allergen Reactions  ? Aspirin Other (See Comments)  ?Upset Stomach  ? ?Current Outpatient Medications on File Prior to Visit  ?Medication Sig Dispense Refill  ?  acetaminophen (TYLENOL) 325 MG tablet Take by mouth  ? lisinopriL-hydrochlorothiazide (ZESTORETIC) 20-12.5 mg tablet Take 2 tablets by mouth once daily  ? ?No current facility-administered medications on file prior to visit.  ? ?History reviewed. No pertinent family history.  ? ?Social History  ? ?Tobacco Use  ?Smoking Status Never  ?Smokeless Tobacco Never  ? ? ?Social History  ? ?Socioeconomic History  ? Marital status: Single  ?Tobacco Use  ? Smoking status: Never  ? Smokeless tobacco: Never  ?Vaping Use  ? Vaping Use: Never used  ?Substance and Sexual Activity  ? Alcohol use: Yes  ? Drug use: Never  ? ?Objective:  ? ?Vitals:  ? ?BP: (!) 150/90  ?Pulse: 62  ?Temp: 36.2 ?C (97.2 ?F)  ?SpO2: 95%  ?Weight: (!) 141 kg (310 lb 12.8 oz)  ?Height: 156.2 cm (5' 1.5")  ? ?Body mass index is 57.77 kg/m?. ? ?Physical Exam  ? ?She appears well on exam ? ?Her abdomen is soft and morbidly obese. There is no hepatomegaly. There is very minimal right upper quadrant abdominal tenderness ? ?Labs, Imaging and Diagnostic Testing: ?I reviewed the notes from her gastroenterologist. I have reviewed her colonoscopy, CT scan, and ultrasound results ? ?Assessment and Plan:  ? ?Diagnoses and all orders for this visit: ? ?Symptomatic cholelithiasis ? ? ? ?I do believe the source of her discomfort is the gallstones as no other source has been found including all scans and endoscopies including an upper endoscopy and colonoscopy. I discussed gallbladder disease with the patient and her son.  We would recommend proceeding with a laparoscopic cholecystectomy based on her symptoms and the known gallstones. I gave him literature regarding this. We discussed the risk of surgery which includes but is not limited to bleeding, infection, injury to surrounding structures, the need to convert to an open procedure, bile duct injury, bile leak, the chance this may not resolve her discomfort, cardiopulmonary issues, etc. They understand and wish to proceed  with surgery. ?

## 2022-02-21 ENCOUNTER — Encounter (HOSPITAL_COMMUNITY): Payer: Self-pay | Admitting: Surgery

## 2022-02-21 ENCOUNTER — Ambulatory Visit (HOSPITAL_COMMUNITY): Payer: 59 | Admitting: Anesthesiology

## 2022-02-21 ENCOUNTER — Other Ambulatory Visit: Payer: Self-pay

## 2022-02-21 ENCOUNTER — Ambulatory Visit (HOSPITAL_BASED_OUTPATIENT_CLINIC_OR_DEPARTMENT_OTHER): Payer: 59 | Admitting: Anesthesiology

## 2022-02-21 ENCOUNTER — Ambulatory Visit (HOSPITAL_COMMUNITY)
Admission: RE | Admit: 2022-02-21 | Discharge: 2022-02-21 | Disposition: A | Discharge status: Home / Self Care | Payer: 59 | Attending: Surgery | Admitting: Surgery

## 2022-02-21 ENCOUNTER — Encounter (HOSPITAL_COMMUNITY)
Admission: RE | Disposition: A | Discharge status: Home / Self Care | Payer: Self-pay | Source: Home / Self Care | Attending: Surgery

## 2022-02-21 DIAGNOSIS — K7460 Unspecified cirrhosis of liver: Secondary | ICD-10-CM | POA: Diagnosis not present

## 2022-02-21 DIAGNOSIS — K8010 Calculus of gallbladder with chronic cholecystitis without obstruction: Secondary | ICD-10-CM | POA: Diagnosis present

## 2022-02-21 DIAGNOSIS — B181 Chronic viral hepatitis B without delta-agent: Secondary | ICD-10-CM | POA: Insufficient documentation

## 2022-02-21 DIAGNOSIS — I10 Essential (primary) hypertension: Secondary | ICD-10-CM

## 2022-02-21 DIAGNOSIS — Z6841 Body Mass Index (BMI) 40.0 and over, adult: Secondary | ICD-10-CM | POA: Insufficient documentation

## 2022-02-21 DIAGNOSIS — K8020 Calculus of gallbladder without cholecystitis without obstruction: Secondary | ICD-10-CM | POA: Diagnosis not present

## 2022-02-21 HISTORY — PX: CHOLECYSTECTOMY: SHX55

## 2022-02-21 LAB — CBC
HCT: 41.3 % (ref 36.0–46.0)
Hemoglobin: 13.8 g/dL (ref 12.0–15.0)
MCH: 32.9 pg (ref 26.0–34.0)
MCHC: 33.4 g/dL (ref 30.0–36.0)
MCV: 98.3 fL (ref 80.0–100.0)
Platelets: 234 10*3/uL (ref 150–400)
RBC: 4.2 MIL/uL (ref 3.87–5.11)
RDW: 13.3 % (ref 11.5–15.5)
WBC: 7.6 10*3/uL (ref 4.0–10.5)
nRBC: 0 % (ref 0.0–0.2)

## 2022-02-21 LAB — BASIC METABOLIC PANEL
Anion gap: 10 (ref 5–15)
BUN: 15 mg/dL (ref 8–23)
CO2: 25 mmol/L (ref 22–32)
Calcium: 8.9 mg/dL (ref 8.9–10.3)
Chloride: 102 mmol/L (ref 98–111)
Creatinine, Ser: 0.88 mg/dL (ref 0.44–1.00)
GFR, Estimated: >60 mL/min (ref >60)
Glucose, Bld: 160 mg/dL — ABNORMAL HIGH (ref 70–99)
Potassium: 3.2 mmol/L — ABNORMAL LOW (ref 3.5–5.1)
Sodium: 137 mmol/L (ref 135–145)

## 2022-02-21 SURGERY — LAPAROSCOPIC CHOLECYSTECTOMY
Anesthesia: General

## 2022-02-21 MED ORDER — LIDOCAINE 2% (20 MG/ML) 5 ML SYRINGE
INTRAMUSCULAR | Status: DC | PRN
  Administered 2022-02-21: 100 mg via INTRAVENOUS

## 2022-02-21 MED ORDER — 0.9 % SODIUM CHLORIDE (POUR BTL) OPTIME
TOPICAL | Status: DC | PRN
  Administered 2022-02-21: 1000 mL

## 2022-02-21 MED ORDER — FENTANYL CITRATE (PF) 250 MCG/5ML IJ SOLN
INTRAMUSCULAR | Status: AC
  Filled 2022-02-21: qty 5

## 2022-02-21 MED ORDER — BUPIVACAINE-EPINEPHRINE (PF) 0.25% -1:200000 IJ SOLN
INTRAMUSCULAR | Status: AC
  Filled 2022-02-21: qty 30

## 2022-02-21 MED ORDER — HYDROMORPHONE HCL 1 MG/ML IJ SOLN
INTRAMUSCULAR | Status: AC
  Administered 2022-02-21: 0.5 mg via INTRAVENOUS
  Filled 2022-02-21: qty 1

## 2022-02-21 MED ORDER — HYDROMORPHONE HCL 1 MG/ML IJ SOLN: 0.2500 mg | INTRAMUSCULAR | Status: DC | PRN

## 2022-02-21 MED ORDER — PHENYLEPHRINE 80 MCG/ML (10ML) SYRINGE FOR IV PUSH (FOR BLOOD PRESSURE SUPPORT)
PREFILLED_SYRINGE | INTRAVENOUS | Status: DC | PRN
  Administered 2022-02-21 (×3): 160 ug via INTRAVENOUS

## 2022-02-21 MED ORDER — LACTATED RINGERS IV SOLN: INTRAVENOUS | Status: DC

## 2022-02-21 MED ORDER — LIDOCAINE 2% (20 MG/ML) 5 ML SYRINGE
INTRAMUSCULAR | Status: AC
  Filled 2022-02-21: qty 5

## 2022-02-21 MED ORDER — CHLORHEXIDINE GLUCONATE 0.12 % MT SOLN
15.0000 mL | Freq: Once | OROMUCOSAL | Status: AC
  Administered 2022-02-21: 15 mL via OROMUCOSAL
  Filled 2022-02-21: qty 15

## 2022-02-21 MED ORDER — BUPIVACAINE-EPINEPHRINE 0.25% -1:200000 IJ SOLN
INTRAMUSCULAR | Status: DC | PRN
  Administered 2022-02-21: 20 mL

## 2022-02-21 MED ORDER — PROPOFOL 10 MG/ML IV BOLUS
INTRAVENOUS | Status: AC
  Filled 2022-02-21: qty 20

## 2022-02-21 MED ORDER — DEXAMETHASONE SODIUM PHOSPHATE 10 MG/ML IJ SOLN
INTRAMUSCULAR | Status: AC
  Filled 2022-02-21: qty 1

## 2022-02-21 MED ORDER — ONDANSETRON HCL 4 MG/2ML IJ SOLN: 4.0000 mg | Freq: Once | INTRAMUSCULAR | Status: DC | PRN

## 2022-02-21 MED ORDER — TRAMADOL HCL 50 MG PO TABS: 50.0000 mg | ORAL_TABLET | Freq: Four times a day (QID) | ORAL | 1 refills | Status: DC | PRN

## 2022-02-21 MED ORDER — MIDAZOLAM HCL 2 MG/2ML IJ SOLN
INTRAMUSCULAR | Status: AC
  Filled 2022-02-21: qty 2

## 2022-02-21 MED ORDER — ORAL CARE MOUTH RINSE: 15.0000 mL | Freq: Once | OROMUCOSAL | Status: AC

## 2022-02-21 MED ORDER — DEXAMETHASONE SODIUM PHOSPHATE 10 MG/ML IJ SOLN
INTRAMUSCULAR | Status: DC | PRN
  Administered 2022-02-21: 10 mg via INTRAVENOUS

## 2022-02-21 MED ORDER — SUGAMMADEX SODIUM 200 MG/2ML IV SOLN
INTRAVENOUS | Status: DC | PRN
  Administered 2022-02-21: 400 mg via INTRAVENOUS
  Administered 2022-02-21: 100 mg via INTRAVENOUS

## 2022-02-21 MED ORDER — FENTANYL CITRATE (PF) 250 MCG/5ML IJ SOLN
INTRAMUSCULAR | Status: DC | PRN
Start: 2022-02-21 — End: 2022-02-21
  Administered 2022-02-21: 100 ug via INTRAVENOUS

## 2022-02-21 MED ORDER — ROCURONIUM BROMIDE 10 MG/ML (PF) SYRINGE
PREFILLED_SYRINGE | INTRAVENOUS | Status: AC
  Filled 2022-02-21: qty 10

## 2022-02-21 MED ORDER — MIDAZOLAM HCL 2 MG/2ML IJ SOLN
INTRAMUSCULAR | Status: DC | PRN
  Administered 2022-02-21: 2 mg via INTRAVENOUS

## 2022-02-21 MED ORDER — OXYCODONE HCL 5 MG/5ML PO SOLN: 5.0000 mg | Freq: Once | ORAL | Status: DC | PRN

## 2022-02-21 MED ORDER — CHLORHEXIDINE GLUCONATE CLOTH 2 % EX PADS: 6.0000 | MEDICATED_PAD | Freq: Once | CUTANEOUS | Status: DC

## 2022-02-21 MED ORDER — ALBUTEROL SULFATE (2.5 MG/3ML) 0.083% IN NEBU
INHALATION_SOLUTION | RESPIRATORY_TRACT | Status: AC
  Administered 2022-02-21: 2.5 mg
  Filled 2022-02-21: qty 3

## 2022-02-21 MED ORDER — ALBUTEROL SULFATE (2.5 MG/3ML) 0.083% IN NEBU: 2.5000 mg | INHALATION_SOLUTION | Freq: Once | RESPIRATORY_TRACT | Status: DC

## 2022-02-21 MED ORDER — CEFAZOLIN IN SODIUM CHLORIDE 3-0.9 GM/100ML-% IV SOLN
3.0000 g | INTRAVENOUS | Status: AC
Start: 2022-02-21 — End: 2022-02-21
  Administered 2022-02-21: 3 g via INTRAVENOUS
  Filled 2022-02-21: qty 100

## 2022-02-21 MED ORDER — SUCCINYLCHOLINE CHLORIDE 200 MG/10ML IV SOSY
PREFILLED_SYRINGE | INTRAVENOUS | Status: DC | PRN
  Administered 2022-02-21: 180 mg via INTRAVENOUS

## 2022-02-21 MED ORDER — ACETAMINOPHEN 500 MG PO TABS
1000.0000 mg | ORAL_TABLET | ORAL | Status: AC
  Administered 2022-02-21: 1000 mg via ORAL
  Filled 2022-02-21: qty 2

## 2022-02-21 MED ORDER — ONDANSETRON HCL 4 MG/2ML IJ SOLN
INTRAMUSCULAR | Status: AC
  Filled 2022-02-21: qty 2

## 2022-02-21 MED ORDER — SODIUM CHLORIDE 0.9 % IR SOLN
Status: DC | PRN
  Administered 2022-02-21: 1000 mL

## 2022-02-21 MED ORDER — OXYCODONE HCL 5 MG PO TABS
ORAL_TABLET | ORAL | Status: AC
  Filled 2022-02-21: qty 1

## 2022-02-21 MED ORDER — PROPOFOL 10 MG/ML IV BOLUS
INTRAVENOUS | Status: DC | PRN
  Administered 2022-02-21: 200 mg via INTRAVENOUS

## 2022-02-21 MED ORDER — ENSURE PRE-SURGERY PO LIQD: 296.0000 mL | Freq: Once | ORAL | Status: DC

## 2022-02-21 MED ORDER — SUCCINYLCHOLINE CHLORIDE 200 MG/10ML IV SOSY
PREFILLED_SYRINGE | INTRAVENOUS | Status: AC
  Filled 2022-02-21: qty 10

## 2022-02-21 MED ORDER — ROCURONIUM BROMIDE 10 MG/ML (PF) SYRINGE
PREFILLED_SYRINGE | INTRAVENOUS | Status: DC | PRN
  Administered 2022-02-21: 40 mg via INTRAVENOUS

## 2022-02-21 MED ORDER — ONDANSETRON HCL 4 MG/2ML IJ SOLN
INTRAMUSCULAR | Status: DC | PRN
  Administered 2022-02-21: 4 mg via INTRAVENOUS

## 2022-02-21 MED ORDER — OXYCODONE HCL 5 MG PO TABS: 5.0000 mg | ORAL_TABLET | Freq: Once | ORAL | Status: DC | PRN

## 2022-02-21 SURGICAL SUPPLY — 34 items
APPLIER CLIP 5 13 M/L LIGAMAX5 (MISCELLANEOUS) ×2
BAG COUNTER SPONGE SURGICOUNT (BAG) ×2 IMPLANT
CANISTER SUCT 3000ML PPV (MISCELLANEOUS) ×2 IMPLANT
CHLORAPREP W/TINT 26 (MISCELLANEOUS) ×2 IMPLANT
CLIP APPLIE 5 13 M/L LIGAMAX5 (MISCELLANEOUS) ×1 IMPLANT
COVER SURGICAL LIGHT HANDLE (MISCELLANEOUS) ×2 IMPLANT
DERMABOND ADVANCED (GAUZE/BANDAGES/DRESSINGS) ×1
DERMABOND ADVANCED .7 DNX12 (GAUZE/BANDAGES/DRESSINGS) ×1 IMPLANT
ELECT REM PT RETURN 9FT ADLT (ELECTROSURGICAL) ×2
ELECTRODE REM PT RTRN 9FT ADLT (ELECTROSURGICAL) ×1 IMPLANT
GLOVE SURG SIGNA 7.5 PF LTX (GLOVE) ×6 IMPLANT
GOWN STRL REUS W/ TWL LRG LVL3 (GOWN DISPOSABLE) ×2 IMPLANT
GOWN STRL REUS W/ TWL XL LVL3 (GOWN DISPOSABLE) ×1 IMPLANT
GOWN STRL REUS W/TWL LRG LVL3 (GOWN DISPOSABLE) ×2
GOWN STRL REUS W/TWL XL LVL3 (GOWN DISPOSABLE) ×1
HEMOSTAT SNOW SURGICEL 2X4 (HEMOSTASIS) ×1 IMPLANT
KIT BASIN OR (CUSTOM PROCEDURE TRAY) ×2 IMPLANT
KIT TURNOVER KIT B (KITS) ×2 IMPLANT
NS IRRIG 1000ML POUR BTL (IV SOLUTION) ×2 IMPLANT
PAD ARMBOARD 7.5X6 YLW CONV (MISCELLANEOUS) ×2 IMPLANT
POUCH SPECIMEN RETRIEVAL 10MM (ENDOMECHANICALS) ×2 IMPLANT
SCISSORS LAP 5X35 DISP (ENDOMECHANICALS) ×2 IMPLANT
SET IRRIG TUBING LAPAROSCOPIC (IRRIGATION / IRRIGATOR) ×2 IMPLANT
SET TUBE SMOKE EVAC HIGH FLOW (TUBING) ×2 IMPLANT
SLEEVE ENDOPATH XCEL 5M (ENDOMECHANICALS) ×4 IMPLANT
SPECIMEN JAR SMALL (MISCELLANEOUS) ×2 IMPLANT
SUT MNCRL AB 4-0 PS2 18 (SUTURE) ×2 IMPLANT
SUT VICRYL 0 AB UR-6 (SUTURE) ×1 IMPLANT
TOWEL GREEN STERILE (TOWEL DISPOSABLE) ×2 IMPLANT
TOWEL GREEN STERILE FF (TOWEL DISPOSABLE) ×2 IMPLANT
TRAY LAPAROSCOPIC MC (CUSTOM PROCEDURE TRAY) ×2 IMPLANT
TROCAR XCEL BLUNT TIP 100MML (ENDOMECHANICALS) ×2 IMPLANT
TROCAR XCEL NON-BLD 5MMX100MML (ENDOMECHANICALS) ×2 IMPLANT
WATER STERILE IRR 1000ML POUR (IV SOLUTION) ×2 IMPLANT

## 2022-02-21 NOTE — Interval H&P Note (Signed)
History and Physical Interval Note:no change in H and P ? ?02/21/2022 ?6:52 AM ? ?Dawn Mahoney  has presented today for surgery, with the diagnosis of symptomatic gallstones.  The various methods of treatment have been discussed with the patient and family. After consideration of risks, benefits and other options for treatment, the patient has consented to  Procedure(s): ?LAPAROSCOPIC CHOLECYSTECTOMY (N/A) as a surgical intervention.  The patient's history has been reviewed, patient examined, no change in status, stable for surgery.  I have reviewed the patient's chart and labs.  Questions were answered to the patient's satisfaction.   ? ? ?[name] ? ? ?

## 2022-02-21 NOTE — Op Note (Signed)
LAPAROSCOPIC CHOLECYSTECTOMY  Procedure Note ? ?Dawn Mahoney ?02/21/2022 ? ? ?Pre-op Diagnosis: symptomatic gallstones ?    ?Post-op Diagnosis: symptomatic gallstones with cirrhosis ? ?Procedure(s): ?LAPAROSCOPIC CHOLECYSTECTOMY ? ?Surgeon(s): ?[name], MD ? ?Anesthesia: General ? ?Staff:  ?Circulator: Rozelle Logan, RN ?Scrub Person: Lovett Sox, CST; Kandra Nicolas, RN ? ?Estimated Blood Loss: Minimal ?              ?Specimens: sent to path ? ?Indications: This is a 66 year old African female with a history of hepatitis who presents with symptomatic gallstones. ? ?Findings: The patient is found to have cirrhosis of her liver.  She did have gallstones.  The gallbladder was sent to pathology ? ?Procedure: The patient is brought to operating identifies correct patient.  She is placed upon the operating table general anesthesia was induced.  Her abdomen was then prepped and draped in usual sterile fashion.  I made a vertical incision several inches above the umbilicus with a scalpel.  I then carried this down to the fascia which was then identified and opened the scalpel.  Hemostat was then used to pass to the peritoneal cavity under direct vision.  A 0 Vicryl pursing suture was placed around the fascial opening.  The [facility] port was placed the opening and insufflation of the abdomen was begun.  I placed a 5 mm trocar in the patient's epigastrium and 2 more in the right upper quadrant all under direct vision.  I was then able to remove the omentum over the top of the liver and found a cirrhotic liver.  The gallbladder was distended.  I was able to easily dissect out the base of the gallbladder.  I achieved a critical window around the cystic duct and cystic artery.  The duct was clipped 3 times proximally once distally and transected.  The artery was likewise clipped proximally distally and transected as well.  I then slowly dissected the gallbladder free from the liver bed with electrocautery.   I then achieved hemostasis in the gallbladder fossa with the cautery and a piece of surgical snow.  I placed the gallbladder in an Endosac and removed the incision at the umbilicus.  I copiously irrigated the abdomen with saline.  Again hemostasis appeared to be achieved.  All ports were then removed under direct vision and the abdomen was deflated.  All incisions were then anesthetized with Marcaine and closed with 4-0 Monocryl sutures and Dermabond.  The patient tolerated the procedure well.  All the counts were correct at the end of the procedure.  The patient was then extubated in the operating room and taken in stable condition to the recovery room. ?        ? ?[name]  ? ?Date: 02/21/2022  Time: 8:19 AM ? ? ? ?

## 2022-02-21 NOTE — Discharge Instructions (Signed)
[facility] ______[facility] LAPAROSCOPIC SURGERY: POST OP INSTRUCTIONS Always review your discharge instruction sheet given to you by the facility where your surgery was performed. IF YOU HAVE DISABILITY OR FAMILY LEAVE FORMS, YOU MUST BRING THEM TO THE OFFICE FOR PROCESSING.   DO NOT GIVE THEM TO YOUR DOCTOR.  A prescription for pain medication may be given to you upon discharge.  Take your pain medication as prescribed, if needed.  If narcotic pain medicine is not needed, then you may take acetaminophen (Tylenol) or ibuprofen (Advil) as needed. Take your usually prescribed medications unless otherwise directed. If you need a refill on your pain medication, please contact your pharmacy.  They will contact our office to request authorization. Prescriptions will not be filled after 5pm or on week-ends. You should follow a light diet the first few days after arrival home, such as soup and crackers, etc.  Be sure to include lots of fluids daily. Most patients will experience some swelling and bruising in the area of the incisions.  Ice packs will help.  Swelling and bruising can take several days to resolve.  It is common to experience some constipation if taking pain medication after surgery.  Increasing fluid intake and taking a stool softener (such as Colace) will usually help or prevent this problem from occurring.  A mild laxative (Milk of Magnesia or Miralax) should be taken according to package instructions if there are no bowel movements after 48 hours. Unless discharge instructions indicate otherwise, you may remove your bandages 24-48 hours after surgery, and you may shower at that time.  You may have steri-strips (small skin tapes) in place directly over the incision.  These strips should be left on the skin for 7-10 days.  If your surgeon used skin glue on the incision, you may shower in 24 hours.  The glue will flake off over the next 2-3 weeks.  Any sutures or staples will be  removed at the office during your follow-up visit. ACTIVITIES:  You may resume regular (light) daily activities beginning the next day--such as daily self-care, walking, climbing stairs--gradually increasing activities as tolerated.  You may have sexual intercourse when it is comfortable.  Refrain from any heavy lifting or straining until approved by your doctor. You may drive when you are no longer taking prescription pain medication, you can comfortably wear a seatbelt, and you can safely maneuver your car and apply brakes. RETURN TO WORK:  __________________________________________________________ You should see your doctor in the office for a follow-up appointment approximately 2-3 weeks after your surgery.  Make sure that you call for this appointment within a day or two after you arrive home to insure a convenient appointment time. OTHER INSTRUCTIONS: __________________________________________________________________________________________________________________________ __________________________________________________________________________________________________________________________ WHEN TO CALL YOUR DOCTOR: Fever over 101.0 Inability to urinate Continued bleeding from incision. Increased pain, redness, or drainage from the incision. Increasing abdominal pain  The clinic staff is available to answer your questions during regular business hours.  Please don't hesitate to call and ask to speak to one of the nurses for clinical concerns.  If you have a medical emergency, go to the nearest emergency room or call 911.  A surgeon from [facility] is always on call at the hospital. [address], [address], [place], [place]  [address] ? [address] [address], [place], [place]   [address] [phone] ? [phone] ? FAX [phone] Web site: [email or web address]  

## 2022-02-21 NOTE — Anesthesia Procedure Notes (Signed)
Procedure Name: Intubation ?Date/Time: 02/21/2022 8:32 AM ?Performed by: Lowella Dell, CRNA ?Pre-anesthesia Checklist: Patient identified, Emergency Drugs available, Suction available and Patient being monitored ?Patient Re-evaluated:Patient Re-evaluated prior to induction ?Oxygen Delivery Method: Circle System Utilized ?Preoxygenation: Pre-oxygenation with 100% oxygen ?Induction Type: IV induction and Rapid sequence ?Laryngoscope Size: Mac and 3 ?Grade View: Grade II ?Tube type: Oral ?Tube size: 7.0 mm ?Number of attempts: 1 ?Airway Equipment and Method: Stylet and Oral airway ?Placement Confirmation: ETT inserted through vocal cords under direct vision, positive ETCO2 and breath sounds checked- equal and bilateral ?Secured at: 23 cm ?Tube secured with: Tape ?Dental Injury: Teeth and Oropharynx as per pre-operative assessment  ? ? ? ? ?

## 2022-02-21 NOTE — Anesthesia Postprocedure Evaluation (Signed)
Anesthesia Post Note ? ?Patient: Dawn Mahoney ? ?Procedure(s) Performed: LAPAROSCOPIC CHOLECYSTECTOMY ? ?  ? ?Patient location during evaluation: PACU ?Anesthesia Type: General ?Level of consciousness: awake and alert ?Pain management: pain level controlled ?Vital Signs Assessment: post-procedure vital signs reviewed and stable ?Respiratory status: spontaneous breathing, nonlabored ventilation, respiratory function stable and patient connected to nasal cannula oxygen ?Cardiovascular status: blood pressure returned to baseline and stable ?Postop Assessment: no apparent nausea or vomiting ?Anesthetic complications: no ? ? ?No notable events documented. ? ?Last Vitals:  ?Vitals:  ? 02/21/22 0835 02/21/22 0845  ?BP:  140/79  ?Pulse: 69 67  ?Resp: 18 (!) 21  ?Temp:    ?SpO2: 97% (!) 89%  ?  ?Last Pain:  ?Vitals:  ? 02/21/22 0830  ?PainSc: Asleep  ? ? ?  ?  ?  ?  ?  ?  ? ?Maddilyn Campus S ? ? ? ? ?

## 2022-02-21 NOTE — Anesthesia Preprocedure Evaluation (Signed)
Anesthesia Evaluation  ?Patient identified by MRN, date of birth, ID band ?Patient awake ? ? ? ?Reviewed: ?Allergy & Precautions, NPO status , Patient's Chart, lab work & pertinent test results ? ?Airway ?Mallampati: III ? ?TM Distance: <3 FB ?Neck ROM: Full ? ? ? Dental ?no notable dental hx. ? ?  ?Pulmonary ?neg pulmonary ROS,  ?  ?Pulmonary exam normal ?breath sounds clear to auscultation ? ? ? ? ? ? Cardiovascular ?hypertension, Pt. on medications ?Normal cardiovascular exam ?Rhythm:Regular Rate:Normal ? ? ?  ?Neuro/Psych ?negative neurological ROS ? negative psych ROS  ? GI/Hepatic ?negative GI ROS, Neg liver ROS,   ?Endo/Other  ?Morbid obesity ? Renal/GU ?negative Renal ROS  ?negative genitourinary ?  ?Musculoskeletal ?negative musculoskeletal ROS ?(+)  ? Abdominal ?  ?Peds ?negative pediatric ROS ?(+)  Hematology ?negative hematology ROS ?(+)   ?Anesthesia Other Findings ? ? Reproductive/Obstetrics ?negative OB ROS ? ?  ? ? ? ? ? ? ? ? ? ? ? ? ? ?  ?  ? ? ? ? ? ? ? ? ?Anesthesia Physical ?Anesthesia Plan ? ?ASA: 3 ? ?Anesthesia Plan: General  ? ?Post-op Pain Management: Dilaudid IV  ? ?Induction: Intravenous ? ?PONV Risk Score and Plan: 3 and Ondansetron, Dexamethasone and Treatment may vary due to age or medical condition ? ?Airway Management Planned: Oral ETT ? ?Additional Equipment:  ? ?Intra-op Plan:  ? ?Post-operative Plan: Extubation in OR ? ?Informed Consent: I have reviewed the patients History and Physical, chart, labs and discussed the procedure including the risks, benefits and alternatives for the proposed anesthesia with the patient or authorized representative who has indicated his/her understanding and acceptance.  ? ? ? ?Dental advisory given ? ?Plan Discussed with: CRNA and Surgeon ? ?Anesthesia Plan Comments:   ? ? ? ? ? ? ?Anesthesia Quick Evaluation ? ?

## 2022-02-21 NOTE — Transfer of Care (Signed)
Immediate Anesthesia Transfer of Care Note ? ?Patient: Dawn Mahoney ? ?Procedure(s) Performed: LAPAROSCOPIC CHOLECYSTECTOMY ? ?Patient Location: PACU ? ?Anesthesia Type:General ? ?Level of Consciousness: awake and patient cooperative ? ?Airway & Oxygen Therapy: Patient Spontanous Breathing and Patient connected to face mask oxygen ? ?Post-op Assessment: Report given to RN and Post -op Vital signs reviewed and stable ? ?Post vital signs: Reviewed and stable ? ?Last Vitals:  ?Vitals Value Taken Time  ?BP 154/77   ?Temp    ?Pulse 70   ?Resp 24 02/21/22 0830  ?SpO2 98   ?Vitals shown include unvalidated device data. ? ?Last Pain:  ?Vitals:  ? 02/21/22 0650  ?PainSc: 0-No pain  ?   ? ?  ? ?Complications: No notable events documented. ?

## 2022-02-22 ENCOUNTER — Encounter (HOSPITAL_COMMUNITY): Payer: Self-pay | Admitting: Surgery

## 2022-02-22 LAB — SURGICAL PATHOLOGY

## 2022-03-16 ENCOUNTER — Telehealth: Payer: Self-pay

## 2022-03-16 NOTE — Telephone Encounter (Signed)
Patient and her son aware of appointment scheduled for 04-25-22 at 10:10am with Dr Ardis Hughs.

## 2022-03-16 NOTE — Telephone Encounter (Signed)
-----   Message from Milus Banister, MD sent at 03/16/2022  9:16 AM EDT ----- Thanks for the message  Dawn Mahoney,  Can you make sure she has OV follow up with me in the next several weeks.  Thanks   ----- Message ----- From: [name], MD Sent: 03/16/2022   9:09 AM EDT To: Milus Banister, MD  This is the pt from [place] that you sent with gallstones and Hep B.  She did well with surgery and had chronic cholecystitis.  She also did have cirrhotic changes in the liver.  I told her and her son. I wanted to let you know for long term follow-up.  Thanks,  [name]

## 2022-04-25 ENCOUNTER — Ambulatory Visit: Payer: 59 | Admitting: Gastroenterology

## 2022-04-25 NOTE — Progress Notes (Deleted)
Review of pertinent gastrointestinal problems: 1.  Chronic hepatitis B diagnosed 2022, no signs of cirrhosis on imaging or blood work, followed by infectious disease who is ordering hepatoma screening imaging regularly. 2.  History of precancerous colon polyps.  Colonoscopy June 2022 found 2 subcentimeter adenomas.  Recall at 7 years recommended. 3.  Upper abdominal pains led to EGD June 2022 which showed mild gastritis.  Biopsies were somewhat suggestive of H. pylori.  Follow-up H. pylori serologies were positive and so Dawn Mahoney was treated with appropriate antibiotics (pylera type abx, 2 weeks).  06/2021 H. pylori stool antigen was negative.   4.  Symptomatic cholelithiasis, underwent laparoscopic cholecystectomy 01/2022 which was uneventful.  The surgeon did notice cirrhotic changes of her liver at that time.

## 2022-06-19 ENCOUNTER — Encounter: Payer: Self-pay | Admitting: Gastroenterology

## 2022-06-19 ENCOUNTER — Ambulatory Visit: Payer: Commercial Managed Care - HMO | Admitting: Gastroenterology

## 2022-06-19 ENCOUNTER — Other Ambulatory Visit (INDEPENDENT_AMBULATORY_CARE_PROVIDER_SITE_OTHER): Payer: Commercial Managed Care - HMO

## 2022-06-19 VITALS — BP 112/82 | HR 57 | Ht 61.0 in | Wt 302.0 lb

## 2022-06-19 DIAGNOSIS — B1910 Unspecified viral hepatitis B without hepatic coma: Secondary | ICD-10-CM | POA: Diagnosis not present

## 2022-06-19 LAB — PROTIME-INR
INR: 1.2 ratio — ABNORMAL HIGH (ref 0.8–1.0)
Prothrombin Time: 13 s (ref 9.6–13.1)

## 2022-06-19 NOTE — Progress Notes (Signed)
____________________________________________________________  Attending physician addendum:  Thank you for sending this case to me. I have reviewed the entire note and agree with the plan.  No varices on June 2022 EGD.  I reviewed Dr. Trevor Mace 02/21/2022 operative note where he describes a cirrhotic appearing liver.  Agreed she needs regular [facility] screening.  Wilfrid Lund, MD  ____________________________________________________________

## 2022-06-19 NOTE — Patient Instructions (Signed)
_______________________________________________________  If you are age 68 or older, your body mass index should be between 23-30. Your Body mass index is 57.06 kg/m. If this is out of the aforementioned range listed, please consider follow up with your Primary Care Provider.  If you are age 48 or younger, your body mass index should be between 19-25. Your Body mass index is 57.06 kg/m. If this is out of the aformentioned range listed, please consider follow up with your Primary Care Provider.   ________________________________________________________  The [facility] providers would like to encourage you to use [facility] to communicate with providers for non-urgent requests or questions.  Due to long hold times on the telephone, sending your provider a message by [facility] may be a faster and more efficient way to get a response.  Please allow 48 business hours for a response.  Please remember that this is for non-urgent requests.  _______________________________________________________  Your provider has requested that you go to the basement level for lab work before leaving today. Press "B" on the elevator. The lab is located at the first door on the left as you exit the elevator.  You have been scheduled for an abdominal ultrasound at [facility] (1st floor of hospital) on 06/27/2022 at 10:00am. Please arrive 30 minutes prior to your appointment for registration. Make certain not to have anything to eat or drink 8 hours prior to your appointment. Should you need to reschedule your appointment, please contact radiology at [phone]. This test typically takes about 30 minutes to perform. =

## 2022-06-19 NOTE — Progress Notes (Signed)
06/19/2022 Domanique Huesman [id number] January 15, 1956  Review of pertinent gastrointestinal problems: 1.  Chronic hepatitis B diagnosed 2022, no signs of cirrhosis on imaging or blood work, followed by infectious disease. 2.  History of precancerous colon polyps.  Colonoscopy June 2022 found 2 subcentimeter adenomas.  Recall at 7 years recommended. 2.  Upper abdominal pains led to EGD June 2022 which showed mild gastritis.  Biopsies were somewhat suggestive of H. pylori.  Follow-up H. pylori serologies were positive and so she was treated with appropriate antibiotics (pylera type abx, 2 weeks).  06/2021 H. pylori stool antigen was negative   HISTORY OF PRESENT ILLNESS: This is a 66 year old female who is a patient of Dr. Ardis Hughs.  She had both EGD and colonoscopy by him as listed above.  She has hepatitis B for which she follows with infectious disease.  She had a cholecystectomy in the spring and at that time Dr. Ninfa Linden noted some cirrhotic appearance of the liver so her wanted her to follow back up here.  She feels fine with no complaints today.  Is here today with her son.   Past Medical History:  Diagnosis Date   Abdominal pain 09/06/2021   Arthritis    both knees   Gallstones 11/2020   Hepatitis B 01/31/2021   CHronic Viral- followed by Dr. Rogene [place]   Hypertension    Past Surgical History:  Procedure Laterality Date   BIOPSY  03/30/2021   Procedure: BIOPSY;  Surgeon: Milus Banister, MD;  Location: [facility];  Service: Endoscopy;;   CHOLECYSTECTOMY N/A 02/21/2022   Procedure: LAPAROSCOPIC CHOLECYSTECTOMY;  Surgeon: [name], MD;  Location: [place];  Service: General;  Laterality: N/A;   COLONOSCOPY WITH PROPOFOL N/A 03/30/2021   Procedure: COLONOSCOPY WITH PROPOFOL;  Surgeon: Milus Banister, MD;  Location: [facility];  Service: Endoscopy;  Laterality: N/A;   ESOPHAGOGASTRODUODENOSCOPY (EGD) WITH PROPOFOL N/A 03/30/2021   Procedure: ESOPHAGOGASTRODUODENOSCOPY (EGD) WITH  PROPOFOL;  Surgeon: Milus Banister, MD;  Location: [facility];  Service: Endoscopy;  Laterality: N/A;   POLYPECTOMY  03/30/2021   Procedure: POLYPECTOMY;  Surgeon: Milus Banister, MD;  Location: [facility];  Service: Endoscopy;;    reports that she has never smoked. She has never used smokeless tobacco. She reports that she does not currently use alcohol. She reports that she does not use drugs. family history is not on file. Allergies  Allergen Reactions   Aspirin     Upset Stomach       Outpatient Encounter Medications as of 06/19/2022  Medication Sig   acetaminophen (TYLENOL) 650 MG CR tablet Take 650 mg by mouth every 8 (eight) hours as needed for pain.   lisinopril-hydrochlorothiazide (ZESTORETIC) 20-12.5 MG tablet Take 2 tablets by mouth daily.   Menthol-Methyl Salicylate (MUSCLE RUB) 10-15 % CREA Apply 1 application topically as needed for muscle pain.   traMADol (ULTRAM) 50 MG tablet Take 1-2 tablets (50-100 mg total) by mouth every 6 (six) hours as needed.   traZODone (DESYREL) 50 MG tablet Take 0.5-1 tablets (25-50 mg total) by mouth at bedtime as needed for sleep.   No facility-administered encounter medications on file as of 06/19/2022.    REVIEW OF SYSTEMS  : All other systems reviewed and negative except where noted in the History of Present Illness.   PHYSICAL EXAM: BP 112/82   Pulse (!) 57   Ht '5\' 1"'$  (1.549 m)   Wt (!) 302 lb (137 kg)   BMI 57.06 kg/m  General: Well developed AA female in no acute distress Head: Normocephalic and atraumatic Eyes:  Sclerae anicteric, conjunctiva pink. Ears: Normal auditory acuity Musculoskeletal: Symmetrical with no gross deformities  Skin: No lesions on visible extremities Extremities: No edema  Neurological: Alert oriented x 4, grossly non-focal Psychological:  Alert and cooperative. Normal mood and affect  ASSESSMENT AND PLAN: *Chronic hepatitis B for which she follows with infectious disease.  At the time of  cholecystectomy Dr. Ninfa Linden noted some cirrhotic changes of the liver.  Her platelets are normal.  We will check a PT/INR, updated LFTs, and AFP.  She is due for liver imaging so we will get and ultrasound with elastography.  CC:  Cletis Athens, MD

## 2022-06-21 LAB — HEPATIC FUNCTION PANEL
AG Ratio: 0.9 (calc) — ABNORMAL LOW (ref 1.0–2.5)
ALT: 30 U/L — ABNORMAL HIGH (ref 6–29)
AST: 34 U/L (ref 10–35)
Albumin: 3.9 g/dL (ref 3.6–5.1)
Alkaline phosphatase (APISO): 62 U/L (ref 37–153)
Bilirubin, Direct: 0.1 mg/dL (ref 0.0–0.2)
Globulin: 4.5 g/dL (calc) — ABNORMAL HIGH (ref 1.9–3.7)
Indirect Bilirubin: 0.4 mg/dL (calc) (ref 0.2–1.2)
Total Bilirubin: 0.5 mg/dL (ref 0.2–1.2)
Total Protein: 8.4 g/dL — ABNORMAL HIGH (ref 6.1–8.1)

## 2022-06-21 LAB — TEST AUTHORIZATION

## 2022-06-21 LAB — AFP TUMOR MARKER: AFP-Tumor Marker: 5.6 ng/mL

## 2022-06-25 ENCOUNTER — Ambulatory Visit
Admission: EM | Admit: 2022-06-25 | Discharge: 2022-06-25 | Disposition: A | Discharge status: Home / Self Care | Payer: Commercial Managed Care - HMO

## 2022-06-25 DIAGNOSIS — L989 Disorder of the skin and subcutaneous tissue, unspecified: Secondary | ICD-10-CM | POA: Diagnosis not present

## 2022-06-25 NOTE — ED Triage Notes (Signed)
Pt. C/o of boil that has been on her arm for over a year. Pt. C/o tenderness to site.

## 2022-06-25 NOTE — Discharge Instructions (Signed)
Please make an appointment with a dermatology practice to have your skin lesion removed and biopsied to rule out malignancy.

## 2022-06-25 NOTE — ED Provider Notes (Signed)
[facility]   MRN: [id number] DOB: 1956-08-28  Subjective:   Dawn Mahoney is a 66 y.o. female presenting for 1 year history of persistent lesion over the left medial aspect of her arm.  Denies tenderness, drainage, bleeding.  No current facility-administered medications for this encounter.  Current Outpatient Medications:    acetaminophen (TYLENOL) 650 MG CR tablet, Take 650 mg by mouth every 8 (eight) hours as needed for pain., Disp: , Rfl:    lisinopril-hydrochlorothiazide (ZESTORETIC) 20-12.5 MG tablet, Take 2 tablets by mouth daily., Disp: 180 tablet, Rfl: 3   Menthol-Methyl Salicylate (MUSCLE RUB) 10-15 % CREA, Apply 1 application topically as needed for muscle pain., Disp: , Rfl:    traMADol (ULTRAM) 50 MG tablet, Take 1-2 tablets (50-100 mg total) by mouth every 6 (six) hours as needed., Disp: 30 tablet, Rfl: 1   traZODone (DESYREL) 50 MG tablet, Take 0.5-1 tablets (25-50 mg total) by mouth at bedtime as needed for sleep., Disp: 30 tablet, Rfl: 3   Allergies  Allergen Reactions   Aspirin     Upset Stomach     Past Medical History:  Diagnosis Date   Abdominal pain 09/06/2021   Arthritis    both knees   Gallstones 11/2020   Hepatitis B 01/31/2021   CHronic Viral- followed by Dr. Rogene [place]   Hypertension      Past Surgical History:  Procedure Laterality Date   BIOPSY  03/30/2021   Procedure: BIOPSY;  Surgeon: Milus Banister, MD;  Location: [facility];  Service: Endoscopy;;   CHOLECYSTECTOMY N/A 02/21/2022   Procedure: LAPAROSCOPIC CHOLECYSTECTOMY;  Surgeon: [name], MD;  Location: [facility];  Service: General;  Laterality: N/A;   COLONOSCOPY WITH PROPOFOL N/A 03/30/2021   Procedure: COLONOSCOPY WITH PROPOFOL;  Surgeon: Milus Banister, MD;  Location: [facility];  Service: Endoscopy;  Laterality: N/A;   ESOPHAGOGASTRODUODENOSCOPY (EGD) WITH PROPOFOL N/A 03/30/2021   Procedure: ESOPHAGOGASTRODUODENOSCOPY (EGD) WITH PROPOFOL;  Surgeon:  Milus Banister, MD;  Location: [facility];  Service: Endoscopy;  Laterality: N/A;   POLYPECTOMY  03/30/2021   Procedure: POLYPECTOMY;  Surgeon: Milus Banister, MD;  Location: [facility];  Service: Endoscopy;;    Family History  Problem Relation Age of Onset   Colon cancer Neg Hx        unknown    Social History   Tobacco Use   Smoking status: Never   Smokeless tobacco: Never  Vaping Use   Vaping Use: Never used  Substance Use Topics   Alcohol use: Not Currently    Comment: ocassional   Drug use: Never    ROS   Objective:   Vitals: BP 117/64 (BP Location: Left Arm)   Pulse 66   Temp 98.5 F (36.9 C)   SpO2 94%   Physical Exam Constitutional:      General: She is not in acute distress.    Appearance: Normal appearance. She is well-developed. She is not ill-appearing, toxic-appearing or diaphoretic.  HENT:     Head: Normocephalic and atraumatic.     Nose: Nose normal.     Mouth/Throat:     Mouth: Mucous membranes are moist.  Eyes:     General: No scleral icterus.       Right eye: No discharge.        Left eye: No discharge.     Extraocular Movements: Extraocular movements intact.  Cardiovascular:     Rate and Rhythm: Normal rate.  Pulmonary:  Effort: Pulmonary effort is normal.  Skin:    General: Skin is warm and dry.     Findings: Lesion (1.5-2cm protruding hyperpigmented lesion over the medial aspect of the proximal left arm) present.  Neurological:     General: No focal deficit present.     Mental Status: She is alert and oriented to person, place, and time.  Psychiatric:        Mood and Affect: Mood normal.        Behavior: Behavior normal.          Assessment and Plan :   PDMP not reviewed this encounter.  1. Skin lesion    Recommended patient have a consultation for skin biopsy of the skin lesion.  Provided her with information to a dermatology practice for this.  There is no tenderness on exam and in general and therefore no  suspicion for bacterial infection of the skin lesion. Counseled patient on potential for adverse effects with medications prescribed/recommended today, ER and return-to-clinic precautions discussed, patient verbalized understanding.    Jaynee Eagles, [place] 06/25/22 1859

## 2022-06-27 ENCOUNTER — Ambulatory Visit (HOSPITAL_COMMUNITY)
Admission: RE | Admit: 2022-06-27 | Discharge: 2022-06-27 | Disposition: A | Discharge status: Home / Self Care | Payer: Commercial Managed Care - HMO | Source: Ambulatory Visit | Attending: Gastroenterology | Admitting: Gastroenterology

## 2022-06-27 DIAGNOSIS — B1910 Unspecified viral hepatitis B without hepatic coma: Secondary | ICD-10-CM | POA: Diagnosis present

## 2022-08-24 ENCOUNTER — Other Ambulatory Visit: Payer: Self-pay | Admitting: Internal Medicine

## 2022-08-24 DIAGNOSIS — Z1152 Encounter for screening for COVID-19: Secondary | ICD-10-CM

## 2022-10-04 ENCOUNTER — Encounter: Payer: Self-pay | Admitting: Internal Medicine

## 2022-10-30 MED ORDER — TRAMADOL HCL 50 MG PO TABS: 50.0000 mg | ORAL_TABLET | Freq: Four times a day (QID) | ORAL | 1 refills | Status: DC | PRN

## 2022-11-19 ENCOUNTER — Other Ambulatory Visit: Payer: Self-pay | Admitting: Internal Medicine

## 2022-11-19 DIAGNOSIS — Z1152 Encounter for screening for COVID-19: Secondary | ICD-10-CM

## 2022-11-23 DIAGNOSIS — K7460 Unspecified cirrhosis of liver: Secondary | ICD-10-CM | POA: Insufficient documentation

## 2022-12-27 ENCOUNTER — Telehealth: Payer: Self-pay

## 2022-12-27 DIAGNOSIS — B1910 Unspecified viral hepatitis B without hepatic coma: Secondary | ICD-10-CM

## 2022-12-27 DIAGNOSIS — B181 Chronic viral hepatitis B without delta-agent: Secondary | ICD-10-CM

## 2022-12-27 NOTE — Telephone Encounter (Signed)
-----   Message from Timothy Lasso, RN sent at 06/28/2022  4:27 PM EDT -----  repeat ultrasound in 6 months to continue for [facility] screening

## 2022-12-27 NOTE — Telephone Encounter (Signed)
Pt aware and order sent to the schedulers

## 2023-01-24 DIAGNOSIS — E785 Hyperlipidemia, unspecified: Secondary | ICD-10-CM | POA: Insufficient documentation

## 2023-01-24 DIAGNOSIS — E559 Vitamin D deficiency, unspecified: Secondary | ICD-10-CM | POA: Insufficient documentation

## 2023-01-25 DIAGNOSIS — M8589 Other specified disorders of bone density and structure, multiple sites: Secondary | ICD-10-CM | POA: Insufficient documentation

## 2023-01-25 DIAGNOSIS — E119 Type 2 diabetes mellitus without complications: Secondary | ICD-10-CM | POA: Insufficient documentation

## 2023-02-09 ENCOUNTER — Ambulatory Visit (HOSPITAL_COMMUNITY): Payer: Commercial Managed Care - HMO

## 2024-04-20 ENCOUNTER — Encounter: Payer: Self-pay | Admitting: Nurse Practitioner

## 2024-04-20 ENCOUNTER — Ambulatory Visit (INDEPENDENT_AMBULATORY_CARE_PROVIDER_SITE_OTHER): Admitting: Nurse Practitioner

## 2024-04-20 VITALS — BP 125/63 | HR 63 | Temp 97.0°F | Wt 283.0 lb

## 2024-04-20 DIAGNOSIS — Z1231 Encounter for screening mammogram for malignant neoplasm of breast: Secondary | ICD-10-CM

## 2024-04-20 DIAGNOSIS — E119 Type 2 diabetes mellitus without complications: Secondary | ICD-10-CM | POA: Diagnosis not present

## 2024-04-20 DIAGNOSIS — E785 Hyperlipidemia, unspecified: Secondary | ICD-10-CM

## 2024-04-20 DIAGNOSIS — Z23 Encounter for immunization: Secondary | ICD-10-CM | POA: Insufficient documentation

## 2024-04-20 DIAGNOSIS — I10 Essential (primary) hypertension: Secondary | ICD-10-CM

## 2024-04-20 DIAGNOSIS — M8580 Other specified disorders of bone density and structure, unspecified site: Secondary | ICD-10-CM | POA: Insufficient documentation

## 2024-04-20 DIAGNOSIS — E66813 Obesity, class 3: Secondary | ICD-10-CM

## 2024-04-20 DIAGNOSIS — M8589 Other specified disorders of bone density and structure, multiple sites: Secondary | ICD-10-CM

## 2024-04-20 DIAGNOSIS — Z6841 Body Mass Index (BMI) 40.0 and over, adult: Secondary | ICD-10-CM

## 2024-04-20 DIAGNOSIS — G4700 Insomnia, unspecified: Secondary | ICD-10-CM | POA: Insufficient documentation

## 2024-04-20 DIAGNOSIS — G8929 Other chronic pain: Secondary | ICD-10-CM | POA: Insufficient documentation

## 2024-04-20 DIAGNOSIS — M25562 Pain in left knee: Secondary | ICD-10-CM

## 2024-04-20 DIAGNOSIS — E559 Vitamin D deficiency, unspecified: Secondary | ICD-10-CM

## 2024-04-20 LAB — POCT GLYCOSYLATED HEMOGLOBIN (HGB A1C): Hemoglobin A1C: 6.4 % — AB (ref 4.0–5.6)

## 2024-04-20 MED ORDER — METFORMIN HCL 1000 MG PO TABS: 1000.0000 mg | ORAL_TABLET | Freq: Two times a day (BID) | ORAL | 1 refills | Status: AC

## 2024-04-20 MED ORDER — LISINOPRIL-HYDROCHLOROTHIAZIDE 20-12.5 MG PO TABS: 2.0000 | ORAL_TABLET | Freq: Every day | ORAL | 0 refills | Status: DC

## 2024-04-20 MED ORDER — LISINOPRIL-HYDROCHLOROTHIAZIDE 20-12.5 MG PO TABS: 2.0000 | ORAL_TABLET | Freq: Every day | ORAL | 2 refills | Status: AC

## 2024-04-20 NOTE — Assessment & Plan Note (Signed)
 Currently not taking vitamin D supplement Checking labs

## 2024-04-20 NOTE — Assessment & Plan Note (Signed)
 Wt Readings from Last 3 Encounters:  04/20/24 283 lb (128.4 kg)  06/19/22 (!) 302 lb (137 kg)  02/21/22 (!) 308 lb 10.3 oz (140 kg)   Body mass index is 53.47 kg/m.  Patient counseled on low-carb diet Encouraged to engage in regular moderate exercises at least 150 minutes weekly as tolerated We discussed starting a GLP-1 but they declined

## 2024-04-20 NOTE — Assessment & Plan Note (Signed)
 Lab Results  Component Value Date   HGBA1C 6.4 (A) 04/20/2024  Controlled on metformin 1000 mg twice daily Patient counseled on low-carb diet Encouraged to engage in regular moderate exercises as tolerated at least 150 minutes weekly Need for diabetes eye exam discussed referral sent We will check urine microalbumin labs and do diabetic foot exam at next visit

## 2024-04-20 NOTE — Assessment & Plan Note (Signed)
 Patient educated on CDC recommendation for the vaccine. Verbal consent was obtained from the patient, vaccine administered by nurse, no sign of adverse reactions noted at this time. Patient education on arm soreness and use of tylenol  for this patient  was discussed. Patient educated on the signs and symptoms of adverse effect and advise to contact the office if they occur.  ?

## 2024-04-20 NOTE — Assessment & Plan Note (Signed)
 Controlled on lisinopril -hydrochlorothiazide  2 tablets daily Medication refilled DASH diet and commitment to daily physical activity for a minimum of 30 minutes discussed and encouraged, as a part of hypertension management. The importance of attaining a healthy weight is also discussed.     04/20/2024   10:41 AM 06/25/2022   12:29 PM 06/19/2022   10:00 AM 02/21/2022    9:30 AM 02/21/2022    9:00 AM 02/21/2022    8:45 AM 02/21/2022    6:25 AM  BP/Weight  Systolic BP 125 117 112 148 150 140 146  Diastolic BP 63 64 82 79 78 79 78  Wt. (Lbs) 283  302    308.64  BMI 53.47 kg/m2  57.06 kg/m2    58.32 kg/m2

## 2024-04-20 NOTE — Patient Instructions (Addendum)
 Sleep Hygiene Tips: Do not watch TV or look at screens within 1 hour of going to bed. If you do, make sure there is a blue light filter (nighttime mode) involved. Try to go to bed around the same time every night. Wake up at the same time within 1 hour of regular time. Ex: If you wake up at 7 AM for work, do not sleep past 8 AM on days that you don't work. Do not drink alcohol before bedtime. Do not consume caffeine-containing beverages after noon or within 9 hours of intended bedtime. Get regular exercise/physical activity in your life, but not within 2 hours of planned bedtime. Do not take naps.  Do not eat within 2 hours of planned bedtime. Melatonin, 3-5 mg 30-60 minutes before planned bedtime may be helpful.  The bed should be for sleep or sex only. If after 20-30 minutes you are unable to fall asleep, get up and do something relaxing. Do this until you feel ready to go to sleep again.     Need for pneumococcal 20-valent conjugate vaccination (Primary)    Need for shingles vaccine    Osteopenia of other site  - VITAMIN D 25 Hydroxy (Vit-D Deficiency, Fractures) - DG Bone Density; Future   Screening mammogram for breast cancer  - MM 3D SCREENING MAMMOGRAM BILATERAL BREAST; Future  . Type 2 diabetes mellitus without complication, without long-term current use of insulin (HCC)  - CMP14+EGFR - Direct LDL - Microalbumin / creatinine urine ratio - POCT glycosylated hemoglobin (Hb A1C) - lisinopril -hydrochlorothiazide  (ZESTORETIC ) 20-12.5 MG tablet; Take 2 tablets by mouth daily.  Dispense: 180 tablet; Refill: 0   Insomnia, unspecified type  - Home sleep test; Future   Chronic pain of left knee   . Primary hypertension  - lisinopril -hydrochlorothiazide  (ZESTORETIC ) 20-12.5 MG tablet; Take 2 tablets by mouth daily.  Dispense: 180 tablet; Refill: 0      It is important that you exercise regularly at least 30 minutes 5 times a week as tolerated  Think about what you will  eat, plan ahead. Choose  clean, green, fresh or frozen over canned, processed or packaged foods which are more sugary, salty and fatty. 70 to 75% of food eaten should be vegetables and fruit. Three meals at set times with snacks allowed between meals, but they must be fruit or vegetables. Aim to eat over a 12 hour period , example 7 am to 7 pm, and STOP after  your last meal of the day. Drink water,generally about 64 ounces per day, no other drink is as healthy. Fruit juice is best enjoyed in a healthy way, by EATING the fruit.  Thanks for choosing [facility] we consider it a privelige to serve you.

## 2024-04-20 NOTE — Progress Notes (Signed)
 New Patient Office Visit  Subjective:  Patient ID: Dawn Mahoney, female    DOB: October 29, 1956  Age: 68 y.o. MRN: [id number]  CC:  Chief Complaint  Patient presents with   Establish Care    HPI Sierria Mahoney is a 68 y.o. female  has a past medical history of Abdominal pain (09/06/2021), Arthritis, Gallstones (11/2020), Hepatitis B (01/31/2021), and Hypertension.  Patient presented establish care for her chronic medical conditions, previous PCP Alfonso Sheffield, PA at [facility] Patient is accompanied by her son who assisted with interpretation  Chronic hepatitis B.  Followed by by hepatology at [facility], was treated with unknown medication while in [place] in 2017, they had plan to continue monitoring labs every 3 to 6 months and repeat fibrosis staging in 1 to 2 years.  She currently denies abdominal pain nausea, vomiting.  Type 2 diabetes.  Currently on metformin 1000 mg twice daily, takes Crestor 10 mg daily for hyperlipidemia.  No complaints of polyphagia polyuria polydipsia  Insomnia.  Patient reports chronic insomnia gets about 4 hours of sleep nightly, she reports snoring, taking Aleve PM at night as needed that helps her to sleep.  Has tried melatonin without improvement.  Hypertension.  Currently on lisinopril -hydrochlorothiazide  20-12.5 mg 1 tablet daily.  Patient denies chest pain shortness of breath edema    Past Medical History:  Diagnosis Date   Abdominal pain 09/06/2021   Arthritis    both knees   Gallstones 11/2020   Hepatitis B 01/31/2021   CHronic Viral- followed by Dr. KYM [place]   Hypertension     Past Surgical History:  Procedure Laterality Date   BIOPSY  03/30/2021   Procedure: BIOPSY;  Surgeon: Teressa Toribio SQUIBB, MD;  Location: [facility];  Service: Endoscopy;;   CHOLECYSTECTOMY N/A 02/21/2022   Procedure: LAPAROSCOPIC CHOLECYSTECTOMY;  Surgeon: Vernetta Berg, MD;  [facility]: [facility];  Service: General;  Laterality: N/A;   COLONOSCOPY WITH  PROPOFOL  N/A 03/30/2021   Procedure: COLONOSCOPY WITH PROPOFOL ;  Surgeon: Teressa Toribio SQUIBB, MD;  Location: [facility];  Service: Endoscopy;  Laterality: N/A;   ESOPHAGOGASTRODUODENOSCOPY (EGD) WITH PROPOFOL  N/A 03/30/2021   Procedure: ESOPHAGOGASTRODUODENOSCOPY (EGD) WITH PROPOFOL ;  Surgeon: Teressa Toribio SQUIBB, MD;  Location: [facility];  Service: Endoscopy;  Laterality: N/A;   POLYPECTOMY  03/30/2021   Procedure: POLYPECTOMY;  Surgeon: Teressa Toribio SQUIBB, MD;  Location: [facility];  Service: Endoscopy;;    Family History  Problem Relation Age of Onset   Colon cancer Neg Hx        unknown    Social History   Socioeconomic History   Marital status: Single    Spouse name: Not on file   Number of children: 8   Years of education: Middle school   Highest education level: 7th grade  Occupational History   Not on file  Tobacco Use   Smoking status: Never   Smokeless tobacco: Never  Vaping Use   Vaping status: Never Used  Substance and Sexual Activity   Alcohol use: Not Currently    Comment: ocassional   Drug use: Never   Sexual activity: Not Currently  Other Topics Concern   Not on file  Social History Narrative   Lives wit her son   Social Drivers of Health   Financial Resource Strain: Low Risk  (04/19/2024)   Overall Financial Resource Strain (CARDIA)    Difficulty of Paying Living Expenses: Not hard at all  Food Insecurity: No Food Insecurity (04/19/2024)   Hunger Vital Sign  Worried About Programme researcher, broadcasting/film/video in the Last Year: Never true    Ran Out of Food in the Last Year: Never true  Transportation Needs: No Transportation Needs (04/19/2024)   PRAPARE - Administrator, Civil Service (Medical): No    Lack of Transportation (Non-Medical): No  Physical Activity: Insufficiently Active (04/19/2024)   Exercise Vital Sign    Days of Exercise per Week: 2 days    Minutes of Exercise per Session: 50 min  Stress: No Stress Concern Present (04/19/2024)   [employer] of Occupational Health - Occupational Stress Questionnaire    Feeling of Stress: Not at all  Social Connections: Moderately Isolated (04/19/2024)   Social Connection and Isolation Panel    Frequency of Communication with Friends and Family: More than three times a week    Frequency of Social Gatherings with Friends and Family: Twice a week    Attends Religious Services: More than 4 times per year    Active Member of [employer] or Organizations: No    Attends Engineer, structural: Not on file    Marital Status: Divorced  Intimate Partner Violence: Not At Risk (11/10/2021)   Humiliation, Afraid, Rape, and Kick questionnaire    Fear of Current or Ex-Partner: No    Emotionally Abused: No    Physically Abused: No    Sexually Abused: No    ROS Review of Systems  Constitutional:  Negative for appetite change, chills, fatigue and fever.  HENT:  Negative for congestion, postnasal drip, rhinorrhea and sneezing.   Respiratory:  Negative for cough, shortness of breath and wheezing.   Cardiovascular:  Negative for chest pain, palpitations and leg swelling.  Gastrointestinal:  Negative for abdominal pain, constipation, nausea and vomiting.  Genitourinary:  Negative for difficulty urinating, dysuria, flank pain and frequency.  Musculoskeletal:  Positive for arthralgias. Negative for back pain, joint swelling and myalgias.  Skin:  Negative for color change, pallor, rash and wound.  Neurological:  Negative for dizziness, facial asymmetry, weakness, numbness and headaches.  Psychiatric/Behavioral:  Negative for behavioral problems, confusion, self-injury and suicidal ideas.     Objective:   Today's Vitals: BP 125/63   Pulse 63   Temp (!) 97 F (36.1 C)   Wt 283 lb (128.4 kg)   SpO2 97%   BMI 53.47 kg/m   Physical Exam Vitals and nursing note reviewed.  Constitutional:      General: She is not in acute distress.    Appearance: Normal appearance. She is obese. She is not  ill-appearing, toxic-appearing or diaphoretic.   Eyes:     General: No scleral icterus.       Right eye: No discharge.        Left eye: No discharge.     Extraocular Movements: Extraocular movements intact.     Conjunctiva/sclera: Conjunctivae normal.    Cardiovascular:     Rate and Rhythm: Normal rate and regular rhythm.     Pulses: Normal pulses.     Heart sounds: Normal heart sounds. No murmur heard.    No friction rub. No gallop.  Pulmonary:     Effort: Pulmonary effort is normal. No respiratory distress.     Breath sounds: Normal breath sounds. No stridor. No wheezing, rhonchi or rales.  Chest:     Chest wall: No tenderness.  Abdominal:     General: There is no distension.     Palpations: Abdomen is soft.     Tenderness: There is no abdominal  tenderness. There is no right CVA tenderness, left CVA tenderness or guarding.   Musculoskeletal:        General: Tenderness present. No swelling, deformity or signs of injury.     Right lower leg: No edema.     Left lower leg: No edema.     Comments: Tenderness on range of motion of left knee, no redness or swelling noted   Skin:    General: Skin is warm and dry.     Capillary Refill: Capillary refill takes less than 2 seconds.     Coloration: Skin is not jaundiced or pale.     Findings: No bruising, erythema or lesion.   Neurological:     Mental Status: She is alert and oriented to person, place, and time.     Motor: No weakness.     Gait: Gait normal.   Psychiatric:        Mood and Affect: Mood normal.        Behavior: Behavior normal.        Thought Content: Thought content normal.        Judgment: Judgment normal.     Assessment & Plan:   Problem List Items Addressed This Visit       Cardiovascular and Mediastinum   Primary hypertension - Primary   Controlled on lisinopril -hydrochlorothiazide  2 tablets daily Medication refilled DASH diet and commitment to daily physical activity for a minimum of 30 minutes  discussed and encouraged, as a part of hypertension management. The importance of attaining a healthy weight is also discussed.     04/20/2024   10:41 AM 06/25/2022   12:29 PM 06/19/2022   10:00 AM 02/21/2022    9:30 AM 02/21/2022    9:00 AM 02/21/2022    8:45 AM 02/21/2022    6:25 AM  BP/Weight  Systolic BP 125 117 112 148 150 140 146  Diastolic BP 63 64 82 79 78 79 78  Wt. (Lbs) 283  302    308.64  BMI 53.47 kg/m2  57.06 kg/m2    58.32 kg/m2           Relevant Medications   rosuvastatin (CRESTOR) 10 MG tablet   lisinopril -hydrochlorothiazide  (ZESTORETIC ) 20-12.5 MG tablet     Endocrine   Type 2 diabetes mellitus without complication, without long-term current use of insulin (HCC)   Lab Results  Component Value Date   HGBA1C 6.4 (A) 04/20/2024  Controlled on metformin 1000 mg twice daily Patient counseled on low-carb diet Encouraged to engage in regular moderate exercises as tolerated at least 150 minutes weekly Need for diabetes eye exam discussed referral sent We will check urine microalbumin labs and do diabetic foot exam at next visit      Relevant Medications   rosuvastatin (CRESTOR) 10 MG tablet   metFORMIN (GLUCOPHAGE) 1000 MG tablet   lisinopril -hydrochlorothiazide  (ZESTORETIC ) 20-12.5 MG tablet   Other Relevant Orders   CMP14+EGFR   Direct LDL   POCT glycosylated hemoglobin (Hb A1C) (Completed)   Ambulatory referral to Ophthalmology   CBC     Musculoskeletal and Integument   Osteopenia of multiple sites   Due for repeat DEXA scan DEXA scan ordered She is currently not taking any medication      Relevant Orders   VITAMIN D 25 Hydroxy (Vit-D Deficiency, Fractures)   DG Bone Density     Other   Class 3 severe obesity due to excess calories without serious comorbidity with body mass index (BMI) of 50.0 to 59.9 in  adult   Wt Readings from Last 3 Encounters:  04/20/24 283 lb (128.4 kg)  06/19/22 (!) 302 lb (137 kg)  02/21/22 (!) 308 lb 10.3 oz (140 kg)    Body mass index is 53.47 kg/m.  Patient counseled on low-carb diet Encouraged to engage in regular moderate exercises at least 150 minutes weekly as tolerated We discussed starting a GLP-1 but they declined       Relevant Medications   metFORMIN (GLUCOPHAGE) 1000 MG tablet   Need for shingles vaccine   Patient educated on CDC recommendation for the vaccine. Verbal consent was obtained from the patient, vaccine administered by nurse, no sign of adverse reactions noted at this time. Patient education on arm soreness and use of tylenol   for this patient  was discussed. Patient educated on the signs and symptoms of adverse effect and advise to contact the office if they occur.       Relevant Orders   Varicella-zoster vaccine subcutaneous (Completed)   Need for pneumococcal 20-valent conjugate vaccination   Patient educated on CDC recommendation for the vaccine. Verbal consent was obtained from the patient, vaccine administered by nurse, no sign of adverse reactions noted at this time. Patient education on arm soreness and use of tylenol   for this patient  was discussed. Patient educated on the signs and symptoms of adverse effect and advise to contact the office if they occur.       Relevant Orders   Pneumococcal conjugate vaccine 20-valent (Completed)   Dyslipidemia, goal LDL below 70   Currently on Crestor 10 mg daily Not fasting so checking direct LDL      Relevant Medications   rosuvastatin (CRESTOR) 10 MG tablet   lisinopril -hydrochlorothiazide  (ZESTORETIC ) 20-12.5 MG tablet   Vitamin D deficiency   Currently not taking vitamin D supplement Checking labs      Chronic pain of left knee   Takes an OTC pain gel and Tylenol  as needed as needed that helps Continue current medication, application of heat stretching exercises encouraged We will consider getting a knee x-ray at next visit      Insomnia   Continue Aleve PM as needed Epworth sleepiness scale score was 10 Sleep  hygiene discussed Sleep study ordered      Relevant Orders   Home sleep test   Other Visit Diagnoses       Screening mammogram for breast cancer       Relevant Orders   MM 3D SCREENING MAMMOGRAM BILATERAL BREAST       Outpatient Encounter Medications as of 04/20/2024  Medication Sig   acetaminophen  (TYLENOL ) 650 MG CR tablet Take 650 mg by mouth every 8 (eight) hours as needed for pain.   Menthol-Methyl Salicylate (MUSCLE RUB) 10-15 % CREA Apply 1 application topically as needed for muscle pain.   Naproxen Sod-diphenhydrAMINE (ALEVE PM PO) Take by mouth.   omeprazole  (PRILOSEC) 40 MG capsule Take 40 mg by mouth daily.   rosuvastatin (CRESTOR) 10 MG tablet Take 10 mg by mouth daily.   [DISCONTINUED] lisinopril -hydrochlorothiazide  (ZESTORETIC ) 20-12.5 MG tablet Take 2 tablets by mouth once daily   [DISCONTINUED] metFORMIN (GLUCOPHAGE) 1000 MG tablet Take 1,000 mg by mouth 2 (two) times daily with a meal.   lisinopril -hydrochlorothiazide  (ZESTORETIC ) 20-12.5 MG tablet Take 2 tablets by mouth daily.   metFORMIN (GLUCOPHAGE) 1000 MG tablet Take 1 tablet (1,000 mg total) by mouth 2 (two) times daily with a meal.   traMADol  (ULTRAM ) 50 MG tablet Take 1-2 tablets (50-100 mg total)  by mouth every 6 (six) hours as needed. (Patient not taking: Reported on 04/20/2024)   traZODone  (DESYREL ) 50 MG tablet Take 0.5-1 tablets (25-50 mg total) by mouth at bedtime as needed for sleep. (Patient not taking: Reported on 04/20/2024)   [DISCONTINUED] lisinopril -hydrochlorothiazide  (ZESTORETIC ) 20-12.5 MG tablet Take 2 tablets by mouth daily.   No facility-administered encounter medications on file as of 04/20/2024.    Follow-up: Return in about 3 months (around 07/21/2024) for DM, HTN, HYPERLIPIDEMIA.   Laurin Paulo R Zaviyar Rahal, FNP

## 2024-04-20 NOTE — Assessment & Plan Note (Addendum)
 Continue Aleve PM as needed Epworth sleepiness scale score was 10 Sleep hygiene discussed Sleep study ordered

## 2024-04-20 NOTE — Assessment & Plan Note (Signed)
 Currently on Crestor 10 mg daily Not fasting so checking direct LDL

## 2024-04-20 NOTE — Assessment & Plan Note (Addendum)
 Takes an OTC pain gel and Tylenol  as needed as needed that helps Continue current medication, application of heat stretching exercises encouraged We will consider getting a knee x-ray at next visit

## 2024-04-20 NOTE — Assessment & Plan Note (Signed)
 Due for repeat DEXA scan DEXA scan ordered She is currently not taking any medication

## 2024-04-21 ENCOUNTER — Ambulatory Visit: Payer: Self-pay | Admitting: Nurse Practitioner

## 2024-04-21 ENCOUNTER — Other Ambulatory Visit: Payer: Self-pay | Admitting: Nurse Practitioner

## 2024-04-21 DIAGNOSIS — E785 Hyperlipidemia, unspecified: Secondary | ICD-10-CM

## 2024-04-21 LAB — CMP14+EGFR
ALT: 39 IU/L — ABNORMAL HIGH (ref 0–32)
AST: 40 IU/L (ref 0–40)
Albumin: 4.3 g/dL (ref 3.9–4.9)
Alkaline Phosphatase: 84 IU/L (ref 44–121)
BUN/Creatinine Ratio: 18 (ref 12–28)
BUN: 17 mg/dL (ref 8–27)
Bilirubin Total: 0.3 mg/dL (ref 0.0–1.2)
CO2: 24 mmol/L (ref 20–29)
Calcium: 9.7 mg/dL (ref 8.7–10.3)
Chloride: 102 mmol/L (ref 96–106)
Creatinine, Ser: 0.92 mg/dL (ref 0.57–1.00)
Globulin, Total: 3.8 g/dL (ref 1.5–4.5)
Glucose: 80 mg/dL (ref 70–99)
Potassium: 4.3 mmol/L (ref 3.5–5.2)
Sodium: 142 mmol/L (ref 134–144)
Total Protein: 8.1 g/dL (ref 6.0–8.5)
eGFR: 67 mL/min/{1.73_m2} (ref >59)

## 2024-04-21 LAB — CBC
Hematocrit: 42.3 % (ref 34.0–46.6)
Hemoglobin: 13.3 g/dL (ref 11.1–15.9)
MCH: 31.9 pg (ref 26.6–33.0)
MCHC: 31.4 g/dL — ABNORMAL LOW (ref 31.5–35.7)
MCV: 101 fL — ABNORMAL HIGH (ref 79–97)
Platelets: 239 10*3/uL (ref 150–450)
RBC: 4.17 x10E6/uL (ref 3.77–5.28)
RDW: 12.7 % (ref 11.7–15.4)
WBC: 7.5 10*3/uL (ref 3.4–10.8)

## 2024-04-21 LAB — LDL CHOLESTEROL, DIRECT: LDL Direct: 56 mg/dL (ref 0–99)

## 2024-04-21 LAB — VITAMIN D 25 HYDROXY (VIT D DEFICIENCY, FRACTURES): Vit D, 25-Hydroxy: 31.4 ng/mL (ref 30.0–100.0)

## 2024-04-21 MED ORDER — ROSUVASTATIN CALCIUM 10 MG PO TABS
10.0000 mg | ORAL_TABLET | Freq: Every day | ORAL | 1 refills | Status: AC
Start: 2024-04-21 — End: ?

## 2024-07-01 ENCOUNTER — Emergency Department (HOSPITAL_COMMUNITY)
Admission: EM | Admit: 2024-07-01 | Discharge: 2024-07-01 | Disposition: A | Discharge status: Home / Self Care | Attending: Emergency Medicine | Admitting: Emergency Medicine

## 2024-07-01 ENCOUNTER — Other Ambulatory Visit: Payer: Self-pay

## 2024-07-01 ENCOUNTER — Emergency Department (HOSPITAL_COMMUNITY)

## 2024-07-01 ENCOUNTER — Encounter (HOSPITAL_COMMUNITY): Payer: Self-pay

## 2024-07-01 DIAGNOSIS — M79604 Pain in right leg: Secondary | ICD-10-CM | POA: Insufficient documentation

## 2024-07-01 DIAGNOSIS — E119 Type 2 diabetes mellitus without complications: Secondary | ICD-10-CM | POA: Insufficient documentation

## 2024-07-01 DIAGNOSIS — M109 Gout, unspecified: Secondary | ICD-10-CM

## 2024-07-01 DIAGNOSIS — Z79899 Other long term (current) drug therapy: Secondary | ICD-10-CM | POA: Insufficient documentation

## 2024-07-01 DIAGNOSIS — Z7984 Long term (current) use of oral hypoglycemic drugs: Secondary | ICD-10-CM | POA: Diagnosis not present

## 2024-07-01 DIAGNOSIS — I10 Essential (primary) hypertension: Secondary | ICD-10-CM | POA: Insufficient documentation

## 2024-07-01 DIAGNOSIS — R609 Edema, unspecified: Secondary | ICD-10-CM

## 2024-07-01 LAB — CBC WITH DIFFERENTIAL/PLATELET
Abs Immature Granulocytes: 0.03 K/uL (ref 0.00–0.07)
Basophils Absolute: 0 K/uL (ref 0.0–0.1)
Basophils Relative: 0 %
Eosinophils Absolute: 0 K/uL (ref 0.0–0.5)
Eosinophils Relative: 0 %
HCT: 42.4 % (ref 36.0–46.0)
Hemoglobin: 13.5 g/dL (ref 12.0–15.0)
Immature Granulocytes: 0 %
Lymphocytes Relative: 23 %
Lymphs Abs: 2.5 K/uL (ref 0.7–4.0)
MCH: 31.6 pg (ref 26.0–34.0)
MCHC: 31.8 g/dL (ref 30.0–36.0)
MCV: 99.3 fL (ref 80.0–100.0)
Monocytes Absolute: 0.5 K/uL (ref 0.1–1.0)
Monocytes Relative: 4 %
Neutro Abs: 8 K/uL — ABNORMAL HIGH (ref 1.7–7.7)
Neutrophils Relative %: 73 %
Platelets: 226 K/uL (ref 150–400)
RBC: 4.27 MIL/uL (ref 3.87–5.11)
RDW: 13.8 % (ref 11.5–15.5)
WBC: 11 K/uL — ABNORMAL HIGH (ref 4.0–10.5)
nRBC: 0 % (ref 0.0–0.2)

## 2024-07-01 LAB — COMPREHENSIVE METABOLIC PANEL WITH GFR
ALT: 36 U/L (ref 0–44)
AST: 36 U/L (ref 15–41)
Albumin: 3.5 g/dL (ref 3.5–5.0)
Alkaline Phosphatase: 52 U/L (ref 38–126)
Anion gap: 13 (ref 5–15)
BUN: 13 mg/dL (ref 8–23)
CO2: 23 mmol/L (ref 22–32)
Calcium: 9.5 mg/dL (ref 8.9–10.3)
Chloride: 99 mmol/L (ref 98–111)
Creatinine, Ser: 0.83 mg/dL (ref 0.44–1.00)
GFR, Estimated: >60 mL/min (ref >60)
Glucose, Bld: 112 mg/dL — ABNORMAL HIGH (ref 70–99)
Potassium: 3.5 mmol/L (ref 3.5–5.1)
Sodium: 135 mmol/L (ref 135–145)
Total Bilirubin: 0.8 mg/dL (ref 0.0–1.2)
Total Protein: 8.7 g/dL — ABNORMAL HIGH (ref 6.5–8.1)

## 2024-07-01 LAB — SEDIMENTATION RATE: Sed Rate: 85 mm/h — ABNORMAL HIGH (ref 0–22)

## 2024-07-01 LAB — C-REACTIVE PROTEIN: CRP: 5.5 mg/dL — ABNORMAL HIGH (ref <1.0)

## 2024-07-01 MED ORDER — COLCHICINE 0.6 MG PO TABS: 0.6000 mg | ORAL_TABLET | Freq: Every day | ORAL | 0 refills | Status: DC

## 2024-07-01 MED ORDER — IBUPROFEN 400 MG PO TABS
600.0000 mg | ORAL_TABLET | Freq: Once | ORAL | Status: AC
  Administered 2024-07-01: 600 mg via ORAL
  Filled 2024-07-01: qty 1

## 2024-07-01 MED ORDER — COLCHICINE 0.6 MG PO TABS
0.6000 mg | ORAL_TABLET | Freq: Two times a day (BID) | ORAL | Status: DC
  Administered 2024-07-01: 0.6 mg via ORAL
  Filled 2024-07-01: qty 1

## 2024-07-01 MED ORDER — IBUPROFEN 800 MG PO TABS: 800.0000 mg | ORAL_TABLET | Freq: Three times a day (TID) | ORAL | Status: DC

## 2024-07-01 MED ORDER — IBUPROFEN 800 MG PO TABS: 800.0000 mg | ORAL_TABLET | Freq: Three times a day (TID) | ORAL | 0 refills | Status: AC

## 2024-07-01 NOTE — ED Provider Notes (Signed)
 [place] EMERGENCY DEPARTMENT AT [facility] Provider Note  CSN: [id number] Arrival date & time: 07/01/24 9051  Chief Complaint(s) Leg Pain  HPI Dawn Mahoney is a 68 y.o. female with past medical history as below, significant for hepatitis B, type 2 diabetes who presents to the ED with complaint of right leg pain and swelling that started 2 days ago.  The symptoms began approximately 2 days ago, initially located localized to the ankle healed, but rapidly progressed to involve the entire leg from hip to ankle.  The pain is severe, persistent, and interferes with sleep, with the most intense discomfort noted at the ankle.  Patient denies back or hip pain,, vomiting or other systemic symptoms.  She has difficulty moving the leg, requiring assistance to get out of bed, and the swelling is primarily noted around the ankle. She also endorsed history of fever started since Monday, she denied any trauma.  Past Medical History Past Medical History:  Diagnosis Date   Abdominal pain 09/06/2021   Arthritis    both knees   Gallstones 11/2020   Hepatitis B 01/31/2021   CHronic Viral- followed by Dr. KYM [place]   Hypertension    Patient Active Problem List   Diagnosis Date Noted   Need for shingles vaccine 04/20/2024   Need for pneumococcal 20-valent conjugate vaccination 04/20/2024   Chronic pain of left knee 04/20/2024   Insomnia 04/20/2024   Osteopenia 04/20/2024   Osteopenia of multiple sites 01/25/2023   Type 2 diabetes mellitus without complication, without long-term current use of insulin (HCC) 01/25/2023   Dyslipidemia, goal LDL below 70 01/24/2023   Vitamin D  deficiency 01/24/2023   Morbid obesity with body mass index (BMI) of 50.0 to 59.9 in adult [facility]) 01/23/2023   Cirrhosis of liver without ascites (HCC) 11/23/2022   Abdominal pain 09/06/2021   Primary insomnia 07/12/2021   Gallstones 07/12/2021   Hepatitis B 01/31/2021   Encounter for screening for COVID-19  11/30/2020   Primary hypertension 11/30/2020   Class 3 severe obesity due to excess calories without serious comorbidity with body mass index (BMI) of 50.0 to 59.9 in adult 11/30/2020   OSA (obstructive sleep apnea) 11/30/2020   Home Medication(s) Prior to Admission medications   Medication Sig Start Date End Date Taking? Authorizing Provider  acetaminophen  (TYLENOL ) 650 MG CR tablet Take 650 mg by mouth every 8 (eight) hours as needed for pain.    [provider]  lisinopril -hydrochlorothiazide  (ZESTORETIC ) 20-12.5 MG tablet Take 2 tablets by mouth daily. 04/20/24   Paseda, Folashade R, FNP  Menthol-Methyl Salicylate (MUSCLE RUB) 10-15 % CREA Apply 1 application topically as needed for muscle pain.    [provider]  metFORMIN  (GLUCOPHAGE ) 1000 MG tablet Take 1 tablet (1,000 mg total) by mouth 2 (two) times daily with a meal. 04/20/24   Paseda, Folashade R, FNP  Naproxen Sod-diphenhydrAMINE (ALEVE PM PO) Take by mouth.    [provider]  omeprazole  (PRILOSEC) 40 MG capsule Take 40 mg by mouth daily. 01/23/23   [provider]  rosuvastatin  (CRESTOR ) 10 MG tablet Take 1 tablet (10 mg total) by mouth daily. 04/21/24   Paseda, Folashade R, FNP  traMADol  (ULTRAM ) 50 MG tablet Take 1-2 tablets (50-100 mg total) by mouth every 6 (six) hours as needed. Patient not taking: Reported on 04/20/2024 10/30/22   Britta King, MD  traZODone  (DESYREL ) 50 MG tablet Take 0.5-1 tablets (25-50 mg total) by mouth at bedtime as needed for sleep. Patient not taking:  Reported on 04/20/2024 07/12/21   Britta King, MD                                                                                                                                    Past Surgical History Past Surgical History:  Procedure Laterality Date   BIOPSY  03/30/2021   Procedure: BIOPSY;  Surgeon: Teressa Toribio SQUIBB, MD;  Location: [facility];  Service: Endoscopy;;   CHOLECYSTECTOMY N/A 02/21/2022   Procedure:  LAPAROSCOPIC CHOLECYSTECTOMY;  Surgeon: Vernetta Berg, MD;  Location: [facility] OR;  Service: General;  Laterality: N/A;   COLONOSCOPY WITH PROPOFOL  N/A 03/30/2021   Procedure: COLONOSCOPY WITH PROPOFOL ;  Surgeon: Teressa Toribio SQUIBB, MD;  Location: [facility];  Service: Endoscopy;  Laterality: N/A;   ESOPHAGOGASTRODUODENOSCOPY (EGD) WITH PROPOFOL  N/A 03/30/2021   Procedure: ESOPHAGOGASTRODUODENOSCOPY (EGD) WITH PROPOFOL ;  Surgeon: Teressa Toribio SQUIBB, MD;  Location: [facility];  Service: Endoscopy;  Laterality: N/A;   POLYPECTOMY  03/30/2021   Procedure: POLYPECTOMY;  Surgeon: Teressa Toribio SQUIBB, MD;  Location: [facility];  Service: Endoscopy;;   Family History Family History  Problem Relation Age of Onset   Colon cancer Neg Hx        unknown    Social History Social History   Tobacco Use   Smoking status: Never   Smokeless tobacco: Never  Vaping Use   Vaping status: Never Used  Substance Use Topics   Alcohol use: Not Currently    Comment: ocassional   Drug use: Never   Allergies Aspirin  Review of Systems A thorough review of systems was obtained and all systems are negative except as noted in the HPI and PMH.   Physical Exam Vital Signs  I have reviewed the triage vital signs BP (!) 116/51 (BP Location: Right Arm)   Pulse 74   Temp 98.8 F (37.1 C) (Oral)   Resp 17   Ht 5' 7 (1.702 m)   Wt 128 kg   SpO2 96%   BMI 44.20 kg/m  General: Alert, no acute distress.  HEENT: Soft, supple, with full range of motion. Cardiac: Regular rate and rhythm, normal S1 and S2, no murmurs, rubs, or gallops. Lungs: Clear to auscultation bilaterally in all fields, with no wheezes or crackles appreciated. Normal respiratory effort. Abdomen: Non-distended, soft, non-tender, with no masses or organomegaly appreciated. Normal active bowel sounds. Extremities: Right ankle swelling, tenderness Neurological: Alert and oriented 4.  No focal deficits. Skin: No rashes noted.   ED Results and  Treatments Labs (all labs ordered are listed, but only abnormal results are displayed) Labs Reviewed  CBC WITH DIFFERENTIAL/PLATELET - Abnormal; Notable for the following components:      Result Value   WBC 11.0 (*)    Neutro Abs 8.0 (*)    All other components within normal limits  COMPREHENSIVE METABOLIC PANEL WITH GFR - Abnormal; Notable for the following components:   Glucose, Bld 112 (*)  Total Protein 8.7 (*)    All other components within normal limits  SEDIMENTATION RATE - Abnormal; Notable for the following components:   Sed Rate 85 (*)    All other components within normal limits  C-REACTIVE PROTEIN - Abnormal; Notable for the following components:   CRP 5.5 (*)    All other components within normal limits                                                                                                                          Radiology VAS US  LOWER EXTREMITY VENOUS (DVT) (ONLY [facility]) Result Date: 07/01/2024  Lower Venous DVT Study Patient Name:  DAVELYN GWINN  Date of Exam:   07/01/2024 Medical Rec #: [id number]       Accession #:    [id number] Date of Birth: 11-02-1955      Patient Gender: F Patient Age:   80 years Exam Location:  [facility] Procedure:      VAS US  LOWER EXTREMITY VENOUS (DVT) Referring Phys: MICHAEL PENNA --------------------------------------------------------------------------------  Indications: Edema, and Pain.  Comparison Study: No prior exam. Performing Technologist: Edilia Elden Appl  Examination Guidelines: A complete evaluation includes B-mode imaging, spectral Doppler, color Doppler, and power Doppler as needed of all accessible portions of each vessel. Bilateral testing is considered an integral part of a complete examination. Limited examinations for reoccurring indications may be performed as noted. The reflux portion of the exam is performed with the patient in reverse Trendelenburg.   +---------+---------------+---------+-----------+----------+----------------+ RIGHT    CompressibilityPhasicitySpontaneityPropertiesThrombus Aging   +---------+---------------+---------+-----------+----------+----------------+ CFV      Full           Yes      Yes                                   +---------+---------------+---------+-----------+----------+----------------+ SFJ      Full           Yes      Yes                                   +---------+---------------+---------+-----------+----------+----------------+ FV Prox  Full                                                          +---------+---------------+---------+-----------+----------+----------------+ FV Mid   Full                                                          +---------+---------------+---------+-----------+----------+----------------+  FV DistalFull                                                          +---------+---------------+---------+-----------+----------+----------------+ PFV      Full                                                          +---------+---------------+---------+-----------+----------+----------------+ POP      Full           Yes      Yes                                   +---------+---------------+---------+-----------+----------+----------------+ PTV                                                   Patent by color. +---------+---------------+---------+-----------+----------+----------------+ PERO                                                  Patent by color. +---------+---------------+---------+-----------+----------+----------------+ Limited right lower extremity examination due to swelling and patient body habitus.  +----+---------------+---------+-----------+----------+--------------+ LEFTCompressibilityPhasicitySpontaneityPropertiesThrombus Aging +----+---------------+---------+-----------+----------+--------------+ CFV Full            Yes      Yes                                 +----+---------------+---------+-----------+----------+--------------+ SFJ Full           Yes      Yes                                 +----+---------------+---------+-----------+----------+--------------+    Summary: RIGHT: - There is no evidence of deep vein thrombosis in the lower extremity. However, portions of this examination were limited- see technologist comments above.  - No cystic structure found in the popliteal fossa.  LEFT: - No evidence of common femoral vein obstruction.   *See table(s) above for measurements and observations.    Preliminary    DG Ankle Complete Right Result Date: 07/01/2024 CLINICAL DATA:  Ankle pain and swelling. EXAM: RIGHT ANKLE - COMPLETE 3+ VIEW COMPARISON:  None Available. FINDINGS: No acute fracture or malalignment. Ankle mortise is congruent. Mild degenerative arthropathy of the hindfoot. Calcaneal enthesopathy at the insertion of the Achilles tendon. Small plantar calcaneal spur. Soft tissue swelling at the anterolateral ankle/hindfoot. IMPRESSION: 1. No acute osseous abnormality. 2. Soft tissue swelling at the anterolateral ankle/hindfoot. 3. Calcaneal enthesopathy. Electronically Signed   By: Harrietta Sherry M.D.   On: 07/01/2024 12:06    Pertinent labs & imaging results that were available during my care of the patient were reviewed by me and considered in my medical decision making (  see MDM for details).  Medications Ordered in ED Medications  colchicine  tablet 0.6 mg (has no administration in time range)  ibuprofen  (ADVIL ) tablet 800 mg (has no administration in time range)  ibuprofen  (ADVIL ) tablet 600 mg (600 mg Oral Given 07/01/24 1402)                                                                                                                                     Procedures Procedures  (including critical care time)  Medical Decision Making / ED Course Medical Decision Making:     Jamaya Wermuth is a 68 y.o. female with past medical history as below, significant for hepatitis B, type 2 diabetes who presents to the ED with complaint of right leg pain and swelling that started 2 days ago.  The symptoms began approximately 2 days ago, initially located localized to the ankle healed, but rapidly progressed to involve the entire leg from hip to ankle.  Differential diagnosis: Gout, septic arthritis, cellulitis, DVT  #Acute right leg pain and swelling Patient with history of hepatitis B, hypertension and type 2 diabetes presented with acute right leg pain and swelling that started approximately 2 days ago.  Initially localized to the ankle, and progressed to involve the entire leg.  Onset was gradual and there is no history of trauma.   Plan: -CBC, CMP, ESR: 85, CRP: 5.5 -Right ankle x-ray: No acute osseous abnormality, Soft tissue swelling at the anterolateral ankle/hindfoot.  -Consider Doppler sonography of right leg: There is no evidence of deep vein thrombosis in the lower extremity. However, portions of this examination were limited- see technologist comments above  - Ibuprofen  800 3 times daily - Colchicine  0.6 twice daily    Additional history obtained: -Additional history obtained from family -External records from outside source obtained and reviewed including: Chart review including previous notes, labs, imaging, consultation notes including     Lab Tests: -I ordered, reviewed, and interpreted labs.   The pertinent results include:   Labs Reviewed  CBC WITH DIFFERENTIAL/PLATELET - Abnormal; Notable for the following components:      Result Value   WBC 11.0 (*)    Neutro Abs 8.0 (*)    All other components within normal limits  COMPREHENSIVE METABOLIC PANEL WITH GFR - Abnormal; Notable for the following components:   Glucose, Bld 112 (*)    Total Protein 8.7 (*)    All other components within normal limits  SEDIMENTATION RATE - Abnormal; Notable for  the following components:   Sed Rate 85 (*)    All other components within normal limits  C-REACTIVE PROTEIN - Abnormal; Notable for the following components:   CRP 5.5 (*)    All other components within normal limits    Notable for   EKG   EKG Interpretation Date/Time:    Ventricular Rate:    PR Interval:    QRS Duration:    QT  Interval:    QTC Calculation:   R Axis:      Text Interpretation:           Imaging Studies ordered: I ordered imaging studies including  I independently visualized the following imaging with scope of interpretation limited to determining acute life threatening conditions related to emergency care; findings noted above I agree with the radiologist interpretation If any imaging was obtained with contrast I closely monitored patient for any possible adverse reaction a/w contrast administration in the emergency department   Medicines ordered and prescription drug management: Meds ordered this encounter  Medications   ibuprofen  (ADVIL ) tablet 600 mg   colchicine  tablet 0.6 mg   ibuprofen  (ADVIL ) tablet 800 mg    -I have reviewed the patients home medicines and have made adjustments as needed  Cardiac Monitoring: Continuous pulse oximetry interpreted by myself, 96% on room air.     Reevaluation: After the interventions noted above, I reevaluated the patient and found that they have stayed the same  Co morbidities that complicate the patient evaluation  Past Medical History:  Diagnosis Date   Abdominal pain 09/06/2021   Arthritis    both knees   Gallstones 11/2020   Hepatitis B 01/31/2021   CHronic Viral- followed by Dr. KYM [place]   Hypertension       Dispostion: Disposition decision including need for hospitalization was considered, and patient disposition pending at time of sign out.    Final Clinical Impression(s) / ED Diagnoses Final diagnoses:  None        Bernadine Manos, MD 07/01/24 1502    Pamella Ozell LABOR,  DO 07/07/24 1517

## 2024-07-01 NOTE — ED Notes (Signed)
 Patient transported to X-ray

## 2024-07-01 NOTE — ED Triage Notes (Signed)
 Pt bib ems from home for chronic right leg pain that has progressed over the last 2days with new onset of immobility.

## 2024-07-01 NOTE — Progress Notes (Signed)
 Right lower extremity venous duplex has been completed.  Results can be found in chart review under CV Proc.  07/01/2024 12:27 PM  Mishon Blubaugh Elden Appl, RVT.

## 2024-07-21 ENCOUNTER — Ambulatory Visit: Payer: Self-pay | Admitting: Nurse Practitioner

## 2024-08-17 ENCOUNTER — Encounter (HOSPITAL_BASED_OUTPATIENT_CLINIC_OR_DEPARTMENT_OTHER): Payer: Self-pay

## 2024-08-17 ENCOUNTER — Ambulatory Visit (HOSPITAL_BASED_OUTPATIENT_CLINIC_OR_DEPARTMENT_OTHER): Attending: Nurse Practitioner | Admitting: Internal Medicine

## 2024-08-25 ENCOUNTER — Encounter (HOSPITAL_BASED_OUTPATIENT_CLINIC_OR_DEPARTMENT_OTHER): Payer: Self-pay

## 2024-08-25 ENCOUNTER — Ambulatory Visit (HOSPITAL_BASED_OUTPATIENT_CLINIC_OR_DEPARTMENT_OTHER): Attending: Nurse Practitioner | Admitting: Internal Medicine

## 2024-09-01 ENCOUNTER — Ambulatory Visit (HOSPITAL_BASED_OUTPATIENT_CLINIC_OR_DEPARTMENT_OTHER): Attending: Nurse Practitioner | Admitting: Internal Medicine

## 2024-09-01 VITALS — Ht 61.0 in | Wt 282.0 lb

## 2024-09-01 DIAGNOSIS — G4733 Obstructive sleep apnea (adult) (pediatric): Secondary | ICD-10-CM | POA: Diagnosis not present

## 2024-09-01 DIAGNOSIS — G4700 Insomnia, unspecified: Secondary | ICD-10-CM | POA: Insufficient documentation

## 2024-09-09 ENCOUNTER — Ambulatory Visit (HOSPITAL_BASED_OUTPATIENT_CLINIC_OR_DEPARTMENT_OTHER): Admitting: Internal Medicine

## 2024-09-13 NOTE — Procedures (Signed)
 [facility] [address] [place], [place] [address] Tel: [phone]   Fax: [phone]  Home Sleep Test Interpretation  Patient Name: Dawn Mahoney, Dawn Mahoney Study Date: 09/01/2024  Date of Birth: 1956/02/11 Study Type: HST  Age: 68 year MRN #: [id number]  Sex: Female Interpreting Physician: NEYSA RAMA, [id number]  Height: 5' 1 Referring Physician: Paseda, Folashade, FNP  Weight: 283.0 lbs Recording Tech: Holly Neeriemer RPSGT RST  BMI: 54.1 Scoring Tech: Holly Neeriemer RPSGT RST  ESS: 2 Neck Size: -  %%startinterp%% %%startinterp%% Indications for Polysomnography The patient is a 68 year-old Female who is 5' 1 and weighs 283.0 lbs. Her BMI equals 54.1.  A home sleep apnea test was performed to evaluate for -OSA  Medication  No Data.   Polysomnogram Data A home sleep test recorded the standard physiologic parameters including EKG, nasal and oral airflow.  Respiratory parameters of chest and abdominal movements were recorded with Respiratory Inductance Plethysmography belts.  Oxygen saturation was recorded by pulse oximetry.   Study Architecture The total recording time of the polysomnogram was 383.3 minutes.  The total monitoring time was 384.0 minutes.  Time spent in Supine position was 116.5 minutes.   Respiratory Events The study revealed a presence of 15 obstructive, 3 central, and 2 mixed apneas resulting in an Apnea index of 3.1 events per hour.  There were 129 hypopneas (>=3% desaturation and/or arousal) resulting in an Apnea\Hypopnea Index (AHI >=3% desaturation and/or arousal) of 23.3 events per hour.  There were 76 hypopneas (>=4% desaturation) resulting in an Apnea\Hypopnea Index (AHI >=4% desaturation) of 15.0 events per hour.  There were - Respiratory Effort Related Arousals resulting in a RERA index of - events per hour. The Respiratory Disturbance Index is 23.3 events per hour.  The snore index was 26.4 events per hour.  Mean oxygen  saturation was 96.7%.  The lowest oxygen saturation during monitoring time was 88.0%.  Time spent <=88% oxygen saturation was 0.1 minutes (-).  Cardiac Summary The average pulse rate was 57.2 bpm.  The minimum pulse rate was 48.0 bpm while the maximum pulse rate was 103.0 bpm  Comments: Mild to moderate obstructive sleep apnea, AHI (4%) 15/hr. Snoring with oxygen desaturation to a nadir of 88%, mean 96.7%.  Diagnosis: Obstructive sleep apnea  Recommendations: Suggest autopap 5-15, CCPAP titration sleep study or fitted oral appliance.   This study was personally reviewed and electronically signed by: Rama Neysa, MD Accredited Board Certified in Sleep Medicine Date/Time: 11/1`6/25   12:42    %%endinterp%% %%endinterp%%    Study Overview  Recording Time: 648.0 min. Monitoring Time: 384.0 min.  Analysis Start:  08:54:42 PM Supine Time: 116.5 min.  Analysis Stop:  03:17:59 AM     Study Summary   Count Index Longest Event Duration  Apneas & Hypopneas: 149 23.3  Apneas: 35.1 sec.     Hypopneas: 37.8 sec.  RERAs: - - - sec.  Desaturations: 150 23.4 89.0 sec.  Snores: 169 26.4 3.0 sec.    Minimum Oxygen Saturation: 88.0%    Respiratory Summary   Total Duration Supine Non-Supine   Count Index Average Longest Count Index Count Index  Obstructive Apnea 15 2.3 16.4 35.1 1 0.5 14 3.1   Mixed Apnea 2 0.3 19.9 22.9 1 0.5 1 0.2   Central Apnea 3 0.5 17.3 20.1 - - 3 0.7   Total Apneas 20 3.1 16.9 35.1 2 1.0 18 4.0  Hypopneas 3% 129 20.2 N.A. N.A. 40 20.6 89 20.0   Apneas & Hyp. 3% 149 23.3 N.A. N.A. 42 21.6 107 24.0            Hypopneas 4% 76 11.9 N.A. N.A. 21 10.8 55 12.3  Apneas & Hyp. 4% 96 15.0 N.A. N.A. 23 11.8 73 16.4             RERAs - - - - - - - -  RDI 152 23.8 N.A. N.A. 43 22.1 109 24.4   Oxygen Saturation Summary   Total Supine Non-Supine  Average SpO2 96.7% 97.0% 96.6%  Minimum SpO2 88.0% 88.0% 89.0%   Maximum SpO2 100.0% 100.0% 100.0%   Oxygen  Saturation Distribution  Range (%) Time in range (min) Time in range (%)  90.0 - 100.0 381.9 99.4%  80.0 - 90.0 2.1 0.6%  70.0 - 80.0 - -  60.0 - 70.0 - -  50.0 - 60.0 - -  0.0 - 50.0 - -  Time Spent <=88% SpO2  Range (%) Time in range (min) Time in range (%)  0.0 - 88.0 0.1 0.0%  Cardiac Summary   Total Supine Non-Supine  Average Pulse Rate (BPM) 57.2 56.0 57.8  Minimum Pulse Rate (BPM) 48.0 48.0 49.0  Maximum Pulse Rate (BPM) 103.0 80.0 103.0                     Technologist Comments  -                         Reggy Neysa Bateman, Biomedical Engineer of Sleep Medicine  ELECTRONICALLY SIGNED ON:  09/13/2024, 12:39 PM [facility] PH: [phone]   FX: [phone] ACCREDITED BY THE AMERICAN ACADEMY OF SLEEP MEDICINE

## 2024-09-15 ENCOUNTER — Other Ambulatory Visit: Payer: Self-pay

## 2024-09-15 DIAGNOSIS — G4733 Obstructive sleep apnea (adult) (pediatric): Secondary | ICD-10-CM

## 2024-09-15 NOTE — Progress Notes (Signed)
 [facility]

## 2024-09-30 ENCOUNTER — Encounter: Payer: Self-pay | Admitting: Nurse Practitioner

## 2024-09-30 ENCOUNTER — Ambulatory Visit: Payer: Self-pay | Admitting: Nurse Practitioner

## 2024-09-30 VITALS — BP 127/52 | HR 56 | Wt 283.0 lb

## 2024-09-30 DIAGNOSIS — Z23 Encounter for immunization: Secondary | ICD-10-CM | POA: Diagnosis not present

## 2024-09-30 DIAGNOSIS — R748 Abnormal levels of other serum enzymes: Secondary | ICD-10-CM | POA: Insufficient documentation

## 2024-09-30 DIAGNOSIS — G629 Polyneuropathy, unspecified: Secondary | ICD-10-CM | POA: Insufficient documentation

## 2024-09-30 DIAGNOSIS — M8589 Other specified disorders of bone density and structure, multiple sites: Secondary | ICD-10-CM

## 2024-09-30 DIAGNOSIS — K5900 Constipation, unspecified: Secondary | ICD-10-CM | POA: Insufficient documentation

## 2024-09-30 DIAGNOSIS — E785 Hyperlipidemia, unspecified: Secondary | ICD-10-CM

## 2024-09-30 DIAGNOSIS — Z6841 Body Mass Index (BMI) 40.0 and over, adult: Secondary | ICD-10-CM

## 2024-09-30 DIAGNOSIS — E119 Type 2 diabetes mellitus without complications: Secondary | ICD-10-CM | POA: Diagnosis not present

## 2024-09-30 DIAGNOSIS — I10 Essential (primary) hypertension: Secondary | ICD-10-CM | POA: Diagnosis not present

## 2024-09-30 DIAGNOSIS — Z1231 Encounter for screening mammogram for malignant neoplasm of breast: Secondary | ICD-10-CM

## 2024-09-30 DIAGNOSIS — R1011 Right upper quadrant pain: Secondary | ICD-10-CM

## 2024-09-30 LAB — POCT GLYCOSYLATED HEMOGLOBIN (HGB A1C): Hemoglobin A1C: 6.7 % — AB (ref 4.0–5.6)

## 2024-09-30 MED ORDER — POLYETHYLENE GLYCOL 3350 17 GM/SCOOP PO POWD: 17.0000 g | Freq: Every day | ORAL | 1 refills | Status: AC | PRN

## 2024-09-30 NOTE — Assessment & Plan Note (Signed)
 Date collected 09/28/2022 Alkaline phosphate 179, AST 73, ALT 100 Rechecking labs

## 2024-09-30 NOTE — Assessment & Plan Note (Signed)
 Feels much better , rechecking hepatic panel, advised to follow up with GI if pain does not resolve

## 2024-09-30 NOTE — Assessment & Plan Note (Signed)
 Lab Results  Component Value Date   HGBA1C 6.7 (A) 09/30/2024  Currently well-controlled on metformin  1000 mg twice daily Continue current medications Patient counseled on low-carb diet, encouraged regular moderate exercises at least 150 minutes weekly as tolerated Diabetic foot exam completed referred for diabetic eye exam Follow-up in 4 months

## 2024-09-30 NOTE — Assessment & Plan Note (Signed)
 Maintain hydration and increase intake of vegetables MiraLAX 17 g daily as needed ordered

## 2024-09-30 NOTE — Patient Instructions (Addendum)
 1. Type 2 diabetes mellitus without complication, without long-term current use of insulin (HCC) (Primary)  - Microalbumin/Creatinine Ratio, Urine - POCT glycosylated hemoglobin (Hb A1C)  2. Primary hypertension   3. Dyslipidemia, goal LDL below 70  - Direct LDL  4. Need for influenza vaccination  - Flu vaccine HIGH DOSE PF(Fluzone Trivalent)  5. Elevated liver enzymes  - Hepatic Function Panel  6. Screening mammogram for breast cancer  - MM Digital Screening; Future  Please call [phone]   to schedule your mammogram.  [facility]. [address] [place], [place] [address]. [place] .     7. Constipation, unspecified constipation type  - polyethylene glycol powder (GLYCOLAX/MIRALAX) 17 GM/SCOOP powder; Take 17 g by mouth daily as needed. Dissolve 1 capful (17g) in 4-8 ounces of liquid and take by mouth daily.  Dispense: 510 g; Refill: 1    It is important that you exercise regularly at least 30 minutes 5 times a week as tolerated  Think about what you will eat, plan ahead. Choose  clean, green, fresh or frozen over canned, processed or packaged foods which are more sugary, salty and fatty. 70 to 75% of food eaten should be vegetables and fruit. Three meals at set times with snacks allowed between meals, but they must be fruit or vegetables. Aim to eat over a 12 hour period , example 7 am to 7 pm, and STOP after  your last meal of the day. Drink water,generally about 64 ounces per day, no other drink is as healthy. Fruit juice is best enjoyed in a healthy way, by EATING the fruit.  Thanks for choosing [facility] we consider it a privelige to serve you.

## 2024-09-30 NOTE — Assessment & Plan Note (Addendum)
 BP Readings from Last 3 Encounters:  09/30/24 (!) 127/52  07/01/24 (!) 140/82  04/20/24 125/63   HTN Controlled .  On lisinopril -hydrochlorothiazide  20-12.5 mg, 1 tablet daily Continue current medications. No changes in management. Discussed DASH diet and dietary sodium restrictions Continue to increase dietary efforts and exercise.

## 2024-09-30 NOTE — Assessment & Plan Note (Signed)
 Crestor  10 mg daily Checking direct LDL

## 2024-09-30 NOTE — Assessment & Plan Note (Signed)
 Importance of daily foot exam discussed,advised to report any wound , wear comfortable shoes. Written materials for diabetic foot care provided

## 2024-09-30 NOTE — Progress Notes (Signed)
 Established Patient Office Visit  Subjective:  Patient ID: Dawn Mahoney, female    DOB: 1956/03/20  Age: 69 y.o. MRN: [id number]  CC:  Chief Complaint  Patient presents with   Diabetes   Hypertension   Hyperlipidemia    HPI Dawn Mahoney is a 68 y.o. female  has a past medical history of Abdominal pain (09/06/2021), Arthritis, Gallstones (11/2020), Hepatitis B (01/31/2021), and Hypertension.  Patient presents for follow-up for her chronic medical conditions , she is accompanied by her son. Her son asssited with interpretation as needed  Hypertension.  Currently on lisinopril -hydrochlorothiazide  20-12.5 mg 1 tablet daily.  Patient denies chest pain shortness of breath edema    Type 2 diabetes.  Currently on metformin  1000 mg twice daily takes Crestor  10 mg daily for hyperlipidemia.,  No complaints of polyuria polyphagia polydipsia.  Does walking exercises on some days she has been avoiding sugar sweets soda. No diabetic eye exam in over a year   Abdominal pain Patient was at the urgent care 3 days ago for complaints of abdominal pain nausea, vomiting, she experienced vomiting for 2 days, vomiting has since resolved and her abdominal pain has improved she, x-ray done at the urgent care that was consistent with constipation, liver enzymes were noted to be elevated, currently has mild right upper quadrant pain.  She was prescribed a medication-does not remember the name to help with constipation.  Has history of cholecystectomy, she is being followed by gastroenterologist at [facility] for chronic viral hepatitis B      Past Medical History:  Diagnosis Date   Abdominal pain 09/06/2021   Arthritis    both knees   Gallstones 11/2020   Hepatitis B 01/31/2021   CHronic Viral- followed by Dr. KYM [place]   Hypertension     Past Surgical History:  Procedure Laterality Date   BIOPSY  03/30/2021   Procedure: BIOPSY;  Surgeon: Teressa Toribio SQUIBB, MD;  Location: [facility];  Service:  Endoscopy;;   CHOLECYSTECTOMY N/A 02/21/2022   Procedure: LAPAROSCOPIC CHOLECYSTECTOMY;  Surgeon: Vernetta Berg, MD;  Location: [facility] OR;  Service: General;  Laterality: N/A;   COLONOSCOPY WITH PROPOFOL  N/A 03/30/2021   Procedure: COLONOSCOPY WITH PROPOFOL ;  Surgeon: Teressa Toribio SQUIBB, MD;  Location: [facility];  Service: Endoscopy;  Laterality: N/A;   ESOPHAGOGASTRODUODENOSCOPY (EGD) WITH PROPOFOL  N/A 03/30/2021   Procedure: ESOPHAGOGASTRODUODENOSCOPY (EGD) WITH PROPOFOL ;  Surgeon: Teressa Toribio SQUIBB, MD;  Location: [facility];  Service: Endoscopy;  Laterality: N/A;   POLYPECTOMY  03/30/2021   Procedure: POLYPECTOMY;  Surgeon: Teressa Toribio SQUIBB, MD;  Location: [facility];  Service: Endoscopy;;    Family History  Problem Relation Age of Onset   Colon cancer Neg Hx        unknown    Social History   Socioeconomic History   Marital status: Single    Spouse name: Not on file   Number of children: 8   Years of education: Middle school   Highest education level: 7th grade  Occupational History   Not on file  Tobacco Use   Smoking status: Never   Smokeless tobacco: Never  Vaping Use   Vaping status: Never Used  Substance and Sexual Activity   Alcohol use: Not Currently    Comment: ocassional   Drug use: Never   Sexual activity: Not Currently  Other Topics Concern   Not on file  Social History Narrative   Lives wit her son   Social Drivers of Corporate Investment Banker Strain:  Low Risk  (04/19/2024)   Overall Financial Resource Strain (CARDIA)    Difficulty of Paying Living Expenses: Not hard at all  Food Insecurity: No Food Insecurity (04/19/2024)   Hunger Vital Sign    Worried About Running Out of Food in the Last Year: Never true    Ran Out of Food in the Last Year: Never true  Transportation Needs: No Transportation Needs (04/19/2024)   PRAPARE - Administrator, Civil Service (Medical): No    Lack of Transportation (Non-Medical): No  Physical Activity:  Insufficiently Active (04/19/2024)   Exercise Vital Sign    Days of Exercise per Week: 2 days    Minutes of Exercise per Session: 50 min  Stress: No Stress Concern Present (04/19/2024)   [employer] of Occupational Health - Occupational Stress Questionnaire    Feeling of Stress: Not at all  Social Connections: Moderately Isolated (04/19/2024)   Social Connection and Isolation Panel    Frequency of Communication with Friends and Family: More than three times a week    Frequency of Social Gatherings with Friends and Family: Twice a week    Attends Religious Services: More than 4 times per year    Active Member of [employer] or Organizations: No    Attends Engineer, Structural: Not on file    Marital Status: Divorced  Intimate Partner Violence: Not At Risk (11/10/2021)   Humiliation, Afraid, Rape, and Kick questionnaire    Fear of Current or Ex-Partner: No    Emotionally Abused: No    Physically Abused: No    Sexually Abused: No    Outpatient Medications Prior to Visit  Medication Sig Dispense Refill   acetaminophen  (TYLENOL ) 650 MG CR tablet Take 650 mg by mouth every 8 (eight) hours as needed for pain.     lisinopril -hydrochlorothiazide  (ZESTORETIC ) 20-12.5 MG tablet Take 2 tablets by mouth daily. (Patient taking differently: Take 1 tablet by mouth daily.) 180 tablet 2   Menthol-Methyl Salicylate (MUSCLE RUB) 10-15 % CREA Apply 1 application topically as needed for muscle pain.     metFORMIN  (GLUCOPHAGE ) 1000 MG tablet Take 1 tablet (1,000 mg total) by mouth 2 (two) times daily with a meal. 180 tablet 1   Naproxen Sod-diphenhydrAMINE (ALEVE PM PO) Take by mouth.     omeprazole  (PRILOSEC) 40 MG capsule Take 40 mg by mouth daily.     rosuvastatin  (CRESTOR ) 10 MG tablet Take 1 tablet (10 mg total) by mouth daily. 90 tablet 1   ibuprofen  (ADVIL ) 800 MG tablet Take 1 tablet (800 mg total) by mouth in the morning, at noon, and at bedtime. (Patient not taking: Reported on 09/30/2024) 30  tablet 0   traZODone  (DESYREL ) 50 MG tablet Take 0.5-1 tablets (25-50 mg total) by mouth at bedtime as needed for sleep. (Patient not taking: Reported on 09/30/2024) 30 tablet 3   colchicine  0.6 MG tablet Take 1 tablet (0.6 mg total) by mouth daily. (Patient not taking: Reported on 09/30/2024) 20 tablet 0   traMADol  (ULTRAM ) 50 MG tablet Take 1-2 tablets (50-100 mg total) by mouth every 6 (six) hours as needed. (Patient not taking: Reported on 09/30/2024) 30 tablet 1   No facility-administered medications prior to visit.    Allergies  Allergen Reactions   Aspirin     Upset Stomach     ROS Review of Systems  Constitutional:  Negative for appetite change, chills, fatigue and fever.  HENT:  Negative for congestion, postnasal drip, rhinorrhea and sneezing.  Respiratory:  Negative for cough, shortness of breath and wheezing.   Cardiovascular:  Negative for chest pain, palpitations and leg swelling.  Gastrointestinal:  Positive for abdominal pain and constipation. Negative for nausea and vomiting.  Genitourinary:  Negative for difficulty urinating, dysuria, flank pain and frequency.  Musculoskeletal:  Negative for arthralgias, back pain, joint swelling and myalgias.  Skin:  Negative for color change, pallor, rash and wound.  Neurological:  Negative for dizziness, facial asymmetry, weakness, numbness and headaches.  Psychiatric/Behavioral:  Negative for behavioral problems, confusion, self-injury and suicidal ideas.       Objective:    Physical Exam Vitals and nursing note reviewed.  Constitutional:      General: She is not in acute distress.    Appearance: Normal appearance. She is obese. She is not ill-appearing, toxic-appearing or diaphoretic.  Eyes:     General: No scleral icterus.       Right eye: No discharge.        Left eye: No discharge.     Extraocular Movements: Extraocular movements intact.     Conjunctiva/sclera: Conjunctivae normal.  Cardiovascular:     Rate and  Rhythm: Normal rate and regular rhythm.     Pulses: Normal pulses.     Heart sounds: Normal heart sounds. No murmur heard.    No friction rub. No gallop.  Pulmonary:     Effort: Pulmonary effort is normal. No respiratory distress.     Breath sounds: Normal breath sounds. No stridor. No wheezing, rhonchi or rales.  Chest:     Chest wall: No tenderness.  Abdominal:     General: There is no distension.     Palpations: Abdomen is soft.     Tenderness: There is abdominal tenderness. There is no right CVA tenderness, left CVA tenderness or guarding.     Comments: RUQ  Musculoskeletal:        General: No swelling, tenderness, deformity or signs of injury.     Right lower leg: No edema.     Left lower leg: No edema.  Skin:    General: Skin is warm and dry.     Capillary Refill: Capillary refill takes less than 2 seconds.     Coloration: Skin is not jaundiced or pale.     Findings: No bruising, erythema or lesion.  Neurological:     Mental Status: She is alert and oriented to person, place, and time.     Motor: No weakness.     Gait: Gait normal.  Psychiatric:        Mood and Affect: Mood normal.        Behavior: Behavior normal.        Thought Content: Thought content normal.        Judgment: Judgment normal.     BP (!) 127/52   Pulse (!) 56   Wt 283 lb (128.4 kg)   SpO2 93%   BMI 53.47 kg/m  Wt Readings from Last 3 Encounters:  09/30/24 283 lb (128.4 kg)  09/08/24 282 lb (127.9 kg)  07/01/24 282 lb 3 oz (128 kg)    Lab Results  Component Value Date   TSH 4.43 11/30/2020   Lab Results  Component Value Date   WBC 11.0 (H) 07/01/2024   HGB 13.5 07/01/2024   HCT 42.4 07/01/2024   MCV 99.3 07/01/2024   PLT 226 07/01/2024   Lab Results  Component Value Date   NA 135 07/01/2024   K 3.5 07/01/2024   CO2 23  07/01/2024   GLUCOSE 112 (H) 07/01/2024   BUN 13 07/01/2024   CREATININE 0.83 07/01/2024   BILITOT 0.8 07/01/2024   ALKPHOS 52 07/01/2024   AST 36 07/01/2024    ALT 36 07/01/2024   PROT 8.7 (H) 07/01/2024   ALBUMIN 3.5 07/01/2024   CALCIUM  9.5 07/01/2024   ANIONGAP 13 07/01/2024   EGFR 67 04/20/2024   Lab Results  Component Value Date   CHOL 218 (H) 11/30/2020   Lab Results  Component Value Date   HDL 62 11/30/2020   Lab Results  Component Value Date   LDLCALC 138 (H) 11/30/2020   Lab Results  Component Value Date   TRIG 83 11/30/2020   Lab Results  Component Value Date   CHOLHDL 3.5 11/30/2020   Lab Results  Component Value Date   HGBA1C 6.7 (A) 09/30/2024      Assessment & Plan:   Problem List Items Addressed This Visit       Cardiovascular and Mediastinum   Primary hypertension   BP Readings from Last 3 Encounters:  09/30/24 (!) 127/52  07/01/24 (!) 140/82  04/20/24 125/63   HTN Controlled .  On lisinopril -hydrochlorothiazide  20-12.5 mg, 1 tablet daily Continue current medications. No changes in management. Discussed DASH diet and dietary sodium restrictions Continue to increase dietary efforts and exercise.           Endocrine   Type 2 diabetes mellitus without complication, without long-term current use of insulin (HCC) - Primary   Lab Results  Component Value Date   HGBA1C 6.7 (A) 09/30/2024  Currently well-controlled on metformin  1000 mg twice daily Continue current medications Patient counseled on low-carb diet, encouraged regular moderate exercises at least 150 minutes weekly as tolerated Diabetic foot exam completed referred for diabetic eye exam Follow-up in 4 months      Relevant Orders   Microalbumin/Creatinine Ratio, Urine   POCT glycosylated hemoglobin (Hb A1C) (Completed)     Nervous and Auditory   Neuropathy   Importance of daily foot exam discussed,advised to report any wound , wear comfortable shoes. Written materials for diabetic foot care provided        Musculoskeletal and Integument   Osteopenia of multiple sites   Relevant Orders   DG Bone Density     Other   Morbid  obesity with BMI of 50.0-59.9, adult (HCC)   Wt Readings from Last 3 Encounters:  09/30/24 283 lb (128.4 kg)  09/08/24 282 lb (127.9 kg)  07/01/24 282 lb 3 oz (128 kg)   Body mass index is 53.47 kg/m.  Counseled on low-carb diet Encouraged moderate exercises at least 150 minutes weekly as tolerated      Abdominal pain   Feels much better , rechecking hepatic panel, advised to follow up with GI if pain does not resolve      Dyslipidemia, goal LDL below 70   Crestor  10 mg daily Checking direct LDL      Relevant Orders   Direct LDL   Constipation   Maintain hydration and increase intake of vegetables MiraLAX  17 g daily as needed ordered      Relevant Medications   polyethylene glycol powder (GLYCOLAX /MIRALAX ) 17 GM/SCOOP powder   Elevated liver enzymes   Date collected 09/28/2022 Alkaline phosphate 179, AST 73, ALT 100 Rechecking labs      Relevant Orders   Hepatic Function Panel   Other Visit Diagnoses       Need for influenza vaccination       Relevant  Orders   Flu vaccine HIGH DOSE PF(Fluzone Trivalent) (Completed)     Screening mammogram for breast cancer       Relevant Orders   MM Digital Screening       Meds ordered this encounter  Medications   polyethylene glycol powder (GLYCOLAX/MIRALAX) 17 GM/SCOOP powder    Sig: Take 17 g by mouth daily as needed. Dissolve 1 capful (17g) in 4-8 ounces of liquid and take by mouth daily.    Dispense:  510 g    Refill:  1    Follow-up: No follow-ups on file.    Alyzza Andringa R Kyndle Schlender, FNP

## 2024-09-30 NOTE — Assessment & Plan Note (Signed)
 Wt Readings from Last 3 Encounters:  09/30/24 283 lb (128.4 kg)  09/08/24 282 lb (127.9 kg)  07/01/24 282 lb 3 oz (128 kg)   Body mass index is 53.47 kg/m.  Counseled on low-carb diet Encouraged moderate exercises at least 150 minutes weekly as tolerated

## 2024-10-01 LAB — HEPATIC FUNCTION PANEL
ALT: 73 IU/L — ABNORMAL HIGH (ref 0–32)
AST: 58 IU/L — ABNORMAL HIGH (ref 0–40)
Albumin: 4.1 g/dL (ref 3.9–4.9)
Alkaline Phosphatase: 166 IU/L — ABNORMAL HIGH (ref 49–135)
Bilirubin Total: 0.8 mg/dL (ref 0.0–1.2)
Bilirubin, Direct: 0.53 mg/dL — ABNORMAL HIGH (ref 0.00–0.40)
Total Protein: 8.5 g/dL (ref 6.0–8.5)

## 2024-10-01 LAB — MICROALBUMIN / CREATININE URINE RATIO
Creatinine, Urine: 47.5 mg/dL
Microalb/Creat Ratio: <6 mg/g{creat} (ref 0–29)
Microalbumin, Urine: <3 ug/mL

## 2024-10-01 LAB — LDL CHOLESTEROL, DIRECT: LDL Direct: 87 mg/dL (ref 0–99)

## 2024-10-01 NOTE — Addendum Note (Signed)
 Addended by: VICTORY IHA on: 10/01/2024 09:14 AM   Modules accepted: Orders

## 2024-10-02 ENCOUNTER — Ambulatory Visit: Payer: Self-pay | Admitting: Nurse Practitioner

## 2024-10-02 DIAGNOSIS — R748 Abnormal levels of other serum enzymes: Secondary | ICD-10-CM

## 2025-04-09 ENCOUNTER — Ambulatory Visit: Payer: Self-pay | Admitting: Nurse Practitioner
